# Patient Record
Sex: Male | Born: 1950 | Race: White | Hispanic: No | Marital: Single | Smoking: Former smoker
Health system: Southern US, Community
[De-identification: ages and names within clinical notes are randomized; demographics above are authoritative.]

## PROBLEM LIST (undated history)

## (undated) DIAGNOSIS — J45909 Unspecified asthma, uncomplicated: Secondary | ICD-10-CM

## (undated) DIAGNOSIS — I10 Essential (primary) hypertension: Secondary | ICD-10-CM

## (undated) DIAGNOSIS — I4891 Unspecified atrial fibrillation: Secondary | ICD-10-CM

## (undated) HISTORY — DX: Unspecified atrial fibrillation: I48.91

## (undated) HISTORY — PX: HERNIA REPAIR: SHX51

---

## 2011-06-18 ENCOUNTER — Ambulatory Visit: Payer: Self-pay | Admitting: Internal Medicine

## 2019-05-03 ENCOUNTER — Other Ambulatory Visit: Payer: Self-pay

## 2019-05-03 DIAGNOSIS — Z20822 Contact with and (suspected) exposure to covid-19: Secondary | ICD-10-CM

## 2019-05-04 LAB — NOVEL CORONAVIRUS, NAA: SARS-CoV-2, NAA: NOT DETECTED

## 2019-12-10 ENCOUNTER — Inpatient Hospital Stay
Admission: RE | Admit: 2019-12-10 | Discharge: 2019-12-10 | Disposition: A | Discharge status: Home / Self Care | Payer: Self-pay | Source: Ambulatory Visit

## 2019-12-10 NOTE — Pre-Procedure Instructions (Signed)
Call to patient for preop interview. Patient states that he is going to reschedule his surgery until some time in July due to insurance change. Patient instructed to call Dr. Evelene Croon office on Monday since the office is closed now. Patient acknowledged understanding.

## 2019-12-21 ENCOUNTER — Other Ambulatory Visit: Admission: RE | Admit: 2019-12-21 | Payer: 59 | Source: Ambulatory Visit

## 2020-01-12 ENCOUNTER — Encounter
Admission: RE | Admit: 2020-01-12 | Discharge: 2020-01-12 | Disposition: A | Discharge status: Home / Self Care | Payer: Commercial Managed Care - PPO | Source: Ambulatory Visit | Attending: Urology | Admitting: Urology

## 2020-01-12 ENCOUNTER — Other Ambulatory Visit: Payer: Self-pay

## 2020-01-12 HISTORY — DX: Unspecified asthma, uncomplicated: J45.909

## 2020-01-12 HISTORY — DX: Essential (primary) hypertension: I10

## 2020-01-12 NOTE — Patient Instructions (Signed)
Your procedure is scheduled on: 01/20/20 Report to [facility]. To find out your arrival time please call [phone] between 1PM - 3PM on 01/19/20.  Remember: Instructions that are not followed completely may result in serious medical risk, up to and including death, or upon the discretion of your surgeon and anesthesiologist your surgery may need to be rescheduled.     _X__ 1. Do not eat food after midnight the night before your procedure.                 No gum chewing or hard candies. You may drink clear liquids up to 2 hours                 before you are scheduled to arrive for your surgery- DO not drink clear                 liquids within 2 hours of the start of your surgery.                 Clear Liquids include:  water, apple juice without pulp, clear carbohydrate                 drink such as Clearfast or Gatorade, Black Coffee or Tea (Do not add                 anything to coffee or tea). Diabetics water only  __X__2.  On the morning of surgery brush your teeth with toothpaste and water, you                 may rinse your mouth with mouthwash if you wish.  Do not swallow any              toothpaste of mouthwash.     _X__ 3.  No Alcohol for 24 hours before or after surgery.   _X__ 4.  Do Not Smoke or use e-cigarettes For 24 Hours Prior to Your Surgery.                 Do not use any chewable tobacco products for at least 6 hours prior to                 surgery.  ____  5.  Bring all medications with you on the day of surgery if instructed.   __X__  6.  Notify your doctor if there is any change in your medical condition      (cold, fever, infections).     Do not wear jewelry, make-up, hairpins, clips or nail polish. Do not wear lotions, powders, or perfumes.  Do not shave 48 hours prior to surgery. Men may shave face and neck. Do not bring valuables to the hospital.    [facility] is not responsible for any belongings or  valuables.  Contacts, dentures/partials or body piercings may not be worn into surgery. Bring a case for your contacts, glasses or hearing aids, a denture cup will be supplied. Leave your suitcase in the car. After surgery it may be brought to your room. For patients admitted to the hospital, discharge time is determined by your treatment team.   Patients discharged the day of surgery will not be allowed to drive home.   Please read over the following fact sheets that you were given:   MRSA Information  __X__ Take these medicines the morning of surgery with A SIP OF WATER:  1. diazepam (VALIUM) 5 MG tablet IF NEEDED  2.   3.   4.  5.  6.  ____ Fleet Enema (as directed)   __X__ Use CHG Soap/SAGE wipes as directed  __X__ Use inhalers on the day of surgery  ____ Stop metformin/Janumet/Farxiga 2 days prior to surgery    ____ Take 1/2 of usual insulin dose the night before surgery. No insulin the morning          of surgery.   ____ Stop Blood Thinners Coumadin/Plavix/Xarelto/Pleta/Pradaxa/Eliquis/Effient/Aspirin  on   Or contact your Surgeon, Cardiologist or Medical Doctor regarding  ability to stop your blood thinners  __X__ Stop Anti-inflammatories 7 days before surgery such as Advil, Ibuprofen, Motrin,  BC or Goodies Powder, Naprosyn, Naproxen, Aleve, Aspirin    __X__ Stop all herbal supplements, fish oil or vitamin E until after surgery.    ____ Bring C-Pap to the hospital.    TYLENOL IS OK TO TAKE IF NEEDED

## 2020-01-13 ENCOUNTER — Encounter
Admission: RE | Admit: 2020-01-13 | Discharge: 2020-01-13 | Disposition: A | Discharge status: Home / Self Care | Payer: Commercial Managed Care - PPO | Source: Ambulatory Visit | Attending: Urology | Admitting: Urology

## 2020-01-13 DIAGNOSIS — Z01810 Encounter for preprocedural cardiovascular examination: Secondary | ICD-10-CM | POA: Diagnosis not present

## 2020-01-13 DIAGNOSIS — I10 Essential (primary) hypertension: Secondary | ICD-10-CM | POA: Insufficient documentation

## 2020-01-13 NOTE — H&P (Signed)
NAMECUYLER, VANDYKEN MEDICAL RECORD TL:[id number] ACCOUNT [id number] DATE OF BIRTH:1950-09-07 FACILITY: [facility] LOCATION: [facility] PHYSICIAN:Starlee Corralejo Gilles Chiquito, MD  HISTORY AND PHYSICAL  DATE OF ADMISSION:  01/20/2020  CHIEF COMPLAINT:  Left groin discomfort and swelling.  HISTORY OF PRESENT ILLNESS:  The patient is a 69 year old Caucasian male with right inguinal pain and swelling, who was found to have a reducible inguinal hernia in the office.  He comes in now for right inguinal herniorrhaphy.  PAST MEDICAL HISTORY:    ALLERGIES:  ALLERGIC TO PENICILLIN.  CURRENT MEDICATIONS:  Lisinopril, turmeric, multivitamins, Ventolin, Breo, Ambien, Valium, testosterone cream, and tadalafil.  PAST SURGICAL HISTORY:  Bilateral inguinal herniorrhaphies, 1955.  PAST AND CURRENT MEDICAL CONDITIONS: 1.  Hypertension. 2.  Psoriasis. 3.  Asthma. 4.  Hypogonadism. 5.  BPH. 6.  Erectile dysfunction.  REVIEW OF SYSTEMS:  The patient has decreased auditory acuity.  He denies chest pain, shortness of breath, diabetes, stroke or heart disease.  SOCIAL HISTORY:  The patient quit smoking in 2000 with a 30-pack-year history.  He consumes 4 alcoholic beverages per week.  FAMILY HISTORY:  The patient with a brother who is age 90 with prostate cancer on active surveillance.  He has a father died of heart disease at age 55.  Mother also died of heart disease at age 90.  PHYSICAL EXAMINATION: VITAL SIGNS:  Height 6 feet 1 inch, weight 186, BMI 24. GENERAL:  Well-nourished, white male in no acute distress. HEENT:  Sclerae were clear.  Pupils are equally round, reactive to light and accommodation.  Extraocular movements were intact. NECK:  No palpable masses or tenderness.  Thyroid gland was smooth without palpable nodules. LYMPHATIC:  No palpable cervical or inguinal adenopathy. PULMONARY:  Lungs clear to auscultation. CARDIOVASCULAR:  Regular rhythm and rate without audible murmurs. ABDOMEN:  Soft,  nontender abdomen. GENITOURINARY:  Circumcised.  Testes were smooth, nontender, 18 mL in size each.  He had an easily reducible left inguinal hernia. RECTAL:  Deferred. NEUROMUSCULAR:  Alert and oriented x3.  IMPRESSION:  Symptomatic left inguinal hernia.  PLAN:  Left inguinal herniorrhaphy.  CN/NUANCE  D:01/12/2020 T:01/12/2020 JOB:[id number]

## 2020-01-18 ENCOUNTER — Other Ambulatory Visit: Payer: Self-pay

## 2020-01-18 ENCOUNTER — Other Ambulatory Visit
Admission: RE | Admit: 2020-01-18 | Discharge: 2020-01-18 | Disposition: A | Discharge status: Home / Self Care | Payer: Commercial Managed Care - PPO | Source: Ambulatory Visit | Attending: Urology | Admitting: Urology

## 2020-01-18 DIAGNOSIS — Z20822 Contact with and (suspected) exposure to covid-19: Secondary | ICD-10-CM | POA: Diagnosis not present

## 2020-01-18 LAB — SARS CORONAVIRUS 2 (TAT 6-24 HRS): SARS Coronavirus 2: NEGATIVE

## 2020-01-20 ENCOUNTER — Encounter: Payer: Self-pay | Admitting: Urology

## 2020-01-20 ENCOUNTER — Encounter
Admission: RE | Disposition: A | Discharge status: Home / Self Care | Payer: Self-pay | Source: Home / Self Care | Attending: Urology

## 2020-01-20 ENCOUNTER — Ambulatory Visit: Payer: Commercial Managed Care - PPO | Admitting: Anesthesiology

## 2020-01-20 ENCOUNTER — Ambulatory Visit
Admission: RE | Admit: 2020-01-20 | Discharge: 2020-01-20 | Disposition: A | Discharge status: Home / Self Care | Payer: Commercial Managed Care - PPO | Attending: Urology | Admitting: Urology

## 2020-01-20 ENCOUNTER — Other Ambulatory Visit: Payer: Self-pay

## 2020-01-20 DIAGNOSIS — J45909 Unspecified asthma, uncomplicated: Secondary | ICD-10-CM | POA: Diagnosis not present

## 2020-01-20 DIAGNOSIS — Z79899 Other long term (current) drug therapy: Secondary | ICD-10-CM | POA: Insufficient documentation

## 2020-01-20 DIAGNOSIS — N529 Male erectile dysfunction, unspecified: Secondary | ICD-10-CM | POA: Insufficient documentation

## 2020-01-20 DIAGNOSIS — K4091 Unilateral inguinal hernia, without obstruction or gangrene, recurrent: Secondary | ICD-10-CM | POA: Diagnosis not present

## 2020-01-20 DIAGNOSIS — Z8249 Family history of ischemic heart disease and other diseases of the circulatory system: Secondary | ICD-10-CM | POA: Insufficient documentation

## 2020-01-20 DIAGNOSIS — Z87891 Personal history of nicotine dependence: Secondary | ICD-10-CM | POA: Insufficient documentation

## 2020-01-20 DIAGNOSIS — I10 Essential (primary) hypertension: Secondary | ICD-10-CM | POA: Insufficient documentation

## 2020-01-20 HISTORY — PX: INGUINAL HERNIA REPAIR: SHX194

## 2020-01-20 HISTORY — PX: INSERTION OF MESH: SHX5868

## 2020-01-20 SURGERY — REPAIR, HERNIA, INGUINAL, ADULT
Anesthesia: General | Site: Groin | Laterality: Left

## 2020-01-20 MED ORDER — LIDOCAINE-EPINEPHRINE 1 %-1:100000 IJ SOLN
INTRAMUSCULAR | Status: AC
  Filled 2020-01-20: qty 1

## 2020-01-20 MED ORDER — ORAL CARE MOUTH RINSE: 15.0000 mL | Freq: Once | OROMUCOSAL | Status: AC

## 2020-01-20 MED ORDER — ONDANSETRON HCL 4 MG/2ML IJ SOLN: 4.0000 mg | Freq: Once | INTRAMUSCULAR | Status: DC | PRN

## 2020-01-20 MED ORDER — LIDOCAINE HCL (CARDIAC) PF 100 MG/5ML IV SOSY
PREFILLED_SYRINGE | INTRAVENOUS | Status: DC | PRN
  Administered 2020-01-20: 80 mg via INTRAVENOUS

## 2020-01-20 MED ORDER — CIPROFLOXACIN HCL 500 MG PO TABS
500.0000 mg | ORAL_TABLET | Freq: Two times a day (BID) | ORAL | 0 refills | Status: DC
Start: 2020-01-20 — End: 2020-09-28

## 2020-01-20 MED ORDER — NEOMYCIN-POLYMYXIN B GU 40-200000 IR SOLN
Status: DC | PRN
  Administered 2020-01-20: 2 mL

## 2020-01-20 MED ORDER — ACETAMINOPHEN-CODEINE #3 300-30 MG PO TABS: 1.0000 | ORAL_TABLET | ORAL | 1 refills | Status: DC | PRN

## 2020-01-20 MED ORDER — PHENYLEPHRINE HCL (PRESSORS) 10 MG/ML IV SOLN
INTRAVENOUS | Status: DC | PRN
  Administered 2020-01-20 (×5): 100 ug via INTRAVENOUS

## 2020-01-20 MED ORDER — FAMOTIDINE 20 MG PO TABS
ORAL_TABLET | ORAL | Status: AC
  Administered 2020-01-20: 20 mg via ORAL
  Filled 2020-01-20: qty 1

## 2020-01-20 MED ORDER — FENTANYL CITRATE (PF) 100 MCG/2ML IJ SOLN
INTRAMUSCULAR | Status: AC
  Administered 2020-01-20: 25 ug via INTRAVENOUS
  Filled 2020-01-20: qty 2

## 2020-01-20 MED ORDER — ROCURONIUM BROMIDE 100 MG/10ML IV SOLN
INTRAVENOUS | Status: DC | PRN
  Administered 2020-01-20: 20 mg via INTRAVENOUS
  Administered 2020-01-20: 40 mg via INTRAVENOUS

## 2020-01-20 MED ORDER — DEXAMETHASONE SODIUM PHOSPHATE 10 MG/ML IJ SOLN
INTRAMUSCULAR | Status: AC
  Filled 2020-01-20: qty 1

## 2020-01-20 MED ORDER — FENTANYL CITRATE (PF) 250 MCG/5ML IJ SOLN
INTRAMUSCULAR | Status: AC
  Filled 2020-01-20: qty 5

## 2020-01-20 MED ORDER — DOCUSATE SODIUM 100 MG PO CAPS
200.0000 mg | ORAL_CAPSULE | Freq: Two times a day (BID) | ORAL | 3 refills | Status: AC
Start: 2020-01-20 — End: ?

## 2020-01-20 MED ORDER — MIDAZOLAM HCL 2 MG/2ML IJ SOLN
INTRAMUSCULAR | Status: DC | PRN
  Administered 2020-01-20: 2 mg via INTRAVENOUS

## 2020-01-20 MED ORDER — ONDANSETRON HCL 4 MG/2ML IJ SOLN
INTRAMUSCULAR | Status: AC
  Filled 2020-01-20: qty 2

## 2020-01-20 MED ORDER — ACETAMINOPHEN 10 MG/ML IV SOLN
INTRAVENOUS | Status: DC | PRN
  Administered 2020-01-20: 1000 mg via INTRAVENOUS

## 2020-01-20 MED ORDER — ROCURONIUM BROMIDE 10 MG/ML (PF) SYRINGE
PREFILLED_SYRINGE | INTRAVENOUS | Status: AC
  Filled 2020-01-20: qty 10

## 2020-01-20 MED ORDER — BUPIVACAINE HCL 0.5 % IJ SOLN
INTRAMUSCULAR | Status: DC | PRN
  Administered 2020-01-20: 30 mL

## 2020-01-20 MED ORDER — LIDOCAINE HCL (PF) 1 % IJ SOLN
INTRAMUSCULAR | Status: AC
  Filled 2020-01-20: qty 60

## 2020-01-20 MED ORDER — LACTATED RINGERS IV SOLN: INTRAVENOUS | Status: DC

## 2020-01-20 MED ORDER — KETOROLAC TROMETHAMINE 30 MG/ML IJ SOLN
INTRAMUSCULAR | Status: AC
  Filled 2020-01-20: qty 1

## 2020-01-20 MED ORDER — PROPOFOL 10 MG/ML IV BOLUS
INTRAVENOUS | Status: AC
  Filled 2020-01-20: qty 20

## 2020-01-20 MED ORDER — EPHEDRINE SULFATE 50 MG/ML IJ SOLN
INTRAMUSCULAR | Status: DC | PRN
  Administered 2020-01-20: 10 mg via INTRAVENOUS
  Administered 2020-01-20: 15 mg via INTRAVENOUS

## 2020-01-20 MED ORDER — LEVOFLOXACIN IN D5W 500 MG/100ML IV SOLN
500.0000 mg | INTRAVENOUS | Status: DC
  Administered 2020-01-20: 500 mg via INTRAVENOUS

## 2020-01-20 MED ORDER — DEXAMETHASONE SODIUM PHOSPHATE 10 MG/ML IJ SOLN
INTRAMUSCULAR | Status: DC | PRN
  Administered 2020-01-20: 10 mg via INTRAVENOUS

## 2020-01-20 MED ORDER — ACETAMINOPHEN 10 MG/ML IV SOLN
INTRAVENOUS | Status: AC
  Filled 2020-01-20: qty 100

## 2020-01-20 MED ORDER — PROPOFOL 10 MG/ML IV BOLUS
INTRAVENOUS | Status: DC | PRN
  Administered 2020-01-20: 150 mg via INTRAVENOUS

## 2020-01-20 MED ORDER — LIDOCAINE-EPINEPHRINE 1 %-1:100000 IJ SOLN
INTRAMUSCULAR | Status: DC | PRN
  Administered 2020-01-20: 20 mL

## 2020-01-20 MED ORDER — FAMOTIDINE 20 MG PO TABS: 20.0000 mg | ORAL_TABLET | Freq: Once | ORAL | Status: AC

## 2020-01-20 MED ORDER — KETOROLAC TROMETHAMINE 30 MG/ML IJ SOLN
INTRAMUSCULAR | Status: DC | PRN
  Administered 2020-01-20: 30 mg via INTRAVENOUS

## 2020-01-20 MED ORDER — SUGAMMADEX SODIUM 200 MG/2ML IV SOLN
INTRAVENOUS | Status: DC | PRN
  Administered 2020-01-20: 200 mg via INTRAVENOUS

## 2020-01-20 MED ORDER — EPHEDRINE 5 MG/ML INJ
INTRAVENOUS | Status: AC
  Filled 2020-01-20: qty 10

## 2020-01-20 MED ORDER — LIDOCAINE HCL (PF) 2 % IJ SOLN
INTRAMUSCULAR | Status: AC
  Filled 2020-01-20: qty 5

## 2020-01-20 MED ORDER — VASOPRESSIN 20 UNIT/ML IV SOLN
INTRAVENOUS | Status: DC | PRN
  Administered 2020-01-20: 4 [IU] via INTRAVENOUS

## 2020-01-20 MED ORDER — EPINEPHRINE PF 1 MG/ML IJ SOLN
INTRAMUSCULAR | Status: AC
  Filled 2020-01-20: qty 1

## 2020-01-20 MED ORDER — CHLORHEXIDINE GLUCONATE 0.12 % MT SOLN
OROMUCOSAL | Status: AC
  Administered 2020-01-20: 15 mL via OROMUCOSAL
  Filled 2020-01-20: qty 15

## 2020-01-20 MED ORDER — BUPIVACAINE HCL (PF) 0.5 % IJ SOLN
INTRAMUSCULAR | Status: AC
  Filled 2020-01-20: qty 30

## 2020-01-20 MED ORDER — FENTANYL CITRATE (PF) 100 MCG/2ML IJ SOLN
25.0000 ug | INTRAMUSCULAR | Status: DC | PRN
  Administered 2020-01-20 (×2): 25 ug via INTRAVENOUS

## 2020-01-20 MED ORDER — VASOPRESSIN 20 UNIT/ML IV SOLN
INTRAVENOUS | Status: AC
  Filled 2020-01-20: qty 1

## 2020-01-20 MED ORDER — LIDOCAINE HCL 4 % MT SOLN
OROMUCOSAL | Status: DC | PRN
  Administered 2020-01-20: 4 mL via TOPICAL

## 2020-01-20 MED ORDER — MIDAZOLAM HCL 2 MG/2ML IJ SOLN
INTRAMUSCULAR | Status: AC
  Filled 2020-01-20: qty 2

## 2020-01-20 MED ORDER — ONDANSETRON HCL 4 MG/2ML IJ SOLN
INTRAMUSCULAR | Status: DC | PRN
  Administered 2020-01-20: 4 mg via INTRAVENOUS

## 2020-01-20 MED ORDER — LEVOFLOXACIN IN D5W 500 MG/100ML IV SOLN
INTRAVENOUS | Status: AC
  Filled 2020-01-20: qty 100

## 2020-01-20 MED ORDER — CHLORHEXIDINE GLUCONATE 0.12 % MT SOLN: 15.0000 mL | Freq: Once | OROMUCOSAL | Status: AC

## 2020-01-20 MED ORDER — FENTANYL CITRATE (PF) 250 MCG/5ML IJ SOLN
INTRAMUSCULAR | Status: DC | PRN
  Administered 2020-01-20 (×2): 50 ug via INTRAVENOUS

## 2020-01-20 SURGICAL SUPPLY — 39 items
BLADE SURG 15 STRL LF DISP TIS (BLADE) ×1 IMPLANT
BLADE SURG 15 STRL SS (BLADE) ×1
CANISTER SUCT 1200ML W/VALVE (MISCELLANEOUS) ×2 IMPLANT
CHLORAPREP W/TINT 26 (MISCELLANEOUS) ×2 IMPLANT
COVER WAND RF STERILE (DRAPES) ×2 IMPLANT
DERMABOND ADVANCED (GAUZE/BANDAGES/DRESSINGS) ×1
DERMABOND ADVANCED .7 DNX12 (GAUZE/BANDAGES/DRESSINGS) ×1 IMPLANT
DRAIN PENROSE 1/4X12 LTX STRL (WOUND CARE) ×2 IMPLANT
DRAPE LAPAROTOMY 100X77 ABD (DRAPES) ×2 IMPLANT
DRSG GAUZE FLUFF 36X18 (GAUZE/BANDAGES/DRESSINGS) ×1 IMPLANT
DRSG TEGADERM 4X4.75 (GAUZE/BANDAGES/DRESSINGS) ×2 IMPLANT
DRSG TELFA 4X3 1S NADH ST (GAUZE/BANDAGES/DRESSINGS) ×2 IMPLANT
ELECT REM PT RETURN 9FT ADLT (ELECTROSURGICAL) ×2
ELECTRODE REM PT RTRN 9FT ADLT (ELECTROSURGICAL) ×1 IMPLANT
GLOVE BIO SURGEON STRL SZ7.5 (GLOVE) ×2 IMPLANT
GOWN STRL REUS W/ TWL LRG LVL3 (GOWN DISPOSABLE) ×1 IMPLANT
GOWN STRL REUS W/ TWL XL LVL3 (GOWN DISPOSABLE) ×1 IMPLANT
GOWN STRL REUS W/TWL LRG LVL3 (GOWN DISPOSABLE) ×1
GOWN STRL REUS W/TWL XL LVL3 (GOWN DISPOSABLE) ×1
KIT TURNOVER KIT A (KITS) ×2 IMPLANT
LABEL OR SOLS (LABEL) ×2 IMPLANT
MESH HERNIA 4.5X10 SYS PRO LRG (Mesh General) IMPLANT
MESH HERNIA SYS PROLENE LG (Mesh General) ×1 IMPLANT
NDL HYPO 25X1 1.5 SAFETY (NEEDLE) ×1 IMPLANT
NEEDLE HYPO 25X1 1.5 SAFETY (NEEDLE) ×2 IMPLANT
NS IRRIG 500ML POUR BTL (IV SOLUTION) ×2 IMPLANT
PACK BASIN MINOR (MISCELLANEOUS) ×2 IMPLANT
SPONGE KITTNER 5P (MISCELLANEOUS) ×2 IMPLANT
STRIP CLOSURE SKIN 1/2X4 (GAUZE/BANDAGES/DRESSINGS) ×2 IMPLANT
SUPPORT SCROTAL LG STRP (MISCELLANEOUS) ×2 IMPLANT
SUT CHROMIC 3-0 (SUTURE) ×1
SUT CHROMIC 3-0 54XMFL REEL CR (SUTURE) ×1
SUT PLAIN 3 0 SH 27IN (SUTURE) ×3 IMPLANT
SUT SURGILON 0 BLK (SUTURE) ×2 IMPLANT
SUT VIC AB 4-0 PS2 18 (SUTURE) ×2 IMPLANT
SUTURE CHRMC 3-0 54XMFL REL CR (SUTURE) ×1 IMPLANT
SWABSTK COMLB BENZOIN TINCTURE (MISCELLANEOUS) ×2 IMPLANT
SYR 10ML LL (SYRINGE) ×2 IMPLANT
SYR BULB IRRIG 60ML STRL (SYRINGE) ×2 IMPLANT

## 2020-01-20 NOTE — Anesthesia Postprocedure Evaluation (Signed)
Anesthesia Post Note  Patient: Gerald Garcia  Procedure(s) Performed: HERNIA REPAIR INGUINAL ADULT (Left Groin) INSERTION OF MESH (Left Groin)  Patient location during evaluation: PACU Anesthesia Type: General Level of consciousness: awake and alert Pain management: pain level controlled Vital Signs Assessment: post-procedure vital signs reviewed and stable Respiratory status: spontaneous breathing and respiratory function stable Cardiovascular status: stable Anesthetic complications: no   No complications documented.   Last Vitals:  Vitals:   01/20/20 0943 01/20/20 0948  BP:  124/76  Pulse:  79  Resp:  20  Temp:    SpO2: 94% 94%    Last Pain:  Vitals:   01/20/20 0955  TempSrc:   PainSc: Asleep                 Rozell Kettlewell K

## 2020-01-20 NOTE — Anesthesia Procedure Notes (Signed)
Procedure Name: Intubation Date/Time: 01/20/2020 7:34 AM Performed by: Dava Najjar, CRNA Pre-anesthesia Checklist: Patient identified, Emergency Drugs available, Suction available and Patient being monitored Patient Re-evaluated:Patient Re-evaluated prior to induction Oxygen Delivery Method: Circle system utilized Preoxygenation: Pre-oxygenation with 100% oxygen Induction Type: IV induction Ventilation: Mask ventilation without difficulty Laryngoscope Size: Miller and 2 Grade View: Grade I Tube type: Oral Tube size: 7.5 mm Number of attempts: 1 Airway Equipment and Method: Stylet Placement Confirmation: ETT inserted through vocal cords under direct vision,  positive ETCO2 and breath sounds checked- equal and bilateral Secured at: 24 cm Tube secured with: Tape Dental Injury: Teeth and Oropharynx as per pre-operative assessment

## 2020-01-20 NOTE — Op Note (Signed)
Preoperative diagnosis: Recurrent left inguinal hernia (K40.91)  Postoperative diagnosis: Same  Procedure: Left inguinal herniorrhaphy (CPT [phone])  Surgeon: Suszanne Conners. Evelene Croon MD  Anesthesia: General  Indications:See the history and physical. After informed consent the above procedure(s) were requested     Technique and findings: After adequate general anesthesia had been obtained the lower abdomen and perineum were prepped and draped in usual fashion.  Inguinal incision was made over the previous scar.  Incision was then carried down through the fatty tissue with the electrocautery.  External oblique fascia and spermatic cord were identified.  The external oblique fascia was then opened along the course of its fibers to further expose the spermatic cord.  Cremasteric fibers were bluntly divided further explored exposing the spermatic cord and the hernia sac.  Hernia sac was then bluntly dissected back to the internal ring.  The hernia sac was then ligated with 2-0 Surgilon at its base and excess hernia sac excised and discarded.  Ilioinguinal nerve was identified and carefully preserved.  Retropubic space was developed using finger dissection.  The retropubic space was then irrigated with GU irrigant.  A large con PHS the graft was selected and the circular portion of the graft was placed in the retropubic space and unfurled.  The oblong external portion of the graft was then placed beneath the external oblique fascia.  A keyhole incision in the external portion the graft was made and brought around the spermatic cord and anchored to the inguinal ligament with a 2-0 Surgilon suture.  The distal edge of the external portion of the graft was then anchored to the pubic tubercle with a 2 oh Surgilon suture.  Spermatic cord block was then performed with a solution of 50% 0.5% Marcaine and 50% 1% Xylocaine.  Spermatic cord was then placed into its normal anatomic position.  Sternal oblique fascia was then  reapproximated with interrupted 2-0 Surgilon suture.  Subcutaneous fatty tissue reapproximated 3-0 plain catgut and skin closed with 4-0 subcuticular Vicryl suture.  Dermabond benzoin and Steri-Strips were applied.  Sponge, instrument, and needle counts were noted to be correct.  Blood loss was minimal.  Procedure was then terminated and patient transferred to the recovery room in stable condition.

## 2020-01-20 NOTE — H&P (Signed)
Date of Initial H&P: 01/12/20  History reviewed, patient examined, no change in status, stable for surgery. 

## 2020-01-20 NOTE — Discharge Instructions (Signed)
AMBULATORY SURGERY  DISCHARGE INSTRUCTIONS   1) The drugs that you were given will stay in your system until tomorrow so for the next 24 hours you should not:  A) Drive an automobile B) Make any legal decisions C) Drink any alcoholic beverage   2) You may resume regular meals tomorrow.  Today it is better to start with liquids and gradually work up to solid foods.  You may eat anything you prefer, but it is better to start with liquids, then soup and crackers, and gradually work up to solid foods.   3) Please notify your doctor immediately if you have any unusual bleeding, trouble breathing, redness and pain at the surgery site, drainage, fever, or pain not relieved by medication.    4) Additional Instructions:        Please contact your physician with any problems or Same Day Surgery at [phone], Monday through Friday 6 am to 4 pm, or [place] at [facility] number at [phone].Open Hernia Repair, Adult, Care After This sheet gives you information about how to care for yourself after your procedure. Your health care provider may also give you more specific instructions. If you have problems or questions, contact your health care provider. What can I expect after the procedure? After the procedure, it is common to have:  Mild discomfort.  Slight bruising.  Minor swelling.  Pain in the abdomen. Follow these instructions at home: Incision care   Follow instructions from your health care provider about how to take care of your incision area. Make sure you: ? Wash your hands with soap and water before you change your bandage (dressing). If soap and water are not available, use hand sanitizer. ? Change your dressing as told by your health care provider. ? Leave stitches (sutures), skin glue, or adhesive strips in place. These skin closures may need to stay in place for 2 weeks or longer. If adhesive strip edges start to loosen and curl up, you may trim the loose  edges. Do not remove adhesive strips completely unless your health care provider tells you to do that.  Check your incision area every day for signs of infection. Check for: ? More redness, swelling, or pain. ? More fluid or blood. ? Warmth. ? Pus or a bad smell. Activity  Do not drive or use heavy machinery while taking prescription pain medicine. Do not drive until your health care provider approves.  Until your health care provider approves: ? Do not lift anything that is heavier than 10 lb (4.5 kg). ? Do not play contact sports.  Return to your normal activities as told by your health care provider. Ask your health care provider what activities are safe. General instructions  To prevent or treat constipation while you are taking prescription pain medicine, your health care provider may recommend that you: ? Drink enough fluid to keep your urine clear or pale yellow. ? Take over-the-counter or prescription medicines. ? Eat foods that are high in fiber, such as fresh fruits and vegetables, whole grains, and beans. ? Limit foods that are high in fat and processed sugars, such as fried and sweet foods.  Take over-the-counter and prescription medicines only as told by your health care provider.  Do not take tub baths or go swimming until your health care provider approves.  Keep all follow-up visits as told by your health care provider. This is important. Contact a health care provider if:  You develop a rash.  You have more redness,  rash.  You have more redness, swelling, or pain around your incision.  You have more fluid or blood coming from your incision.  Your incision feels warm to the touch.  You have pus or a bad smell coming from your incision.  You have a fever or chills.  You have blood in your stool (feces).  You have not had a bowel movement in 2-3 days.  Your pain is not controlled with medicine. Get help right away if:  You have chest pain or shortness of  breath.  You feel light-headed or feel faint.  You have severe pain.  You vomit and your pain is worse. This information is not intended to replace advice given to you by your health care provider. Make sure you discuss any questions you have with your health care provider. Document Revised: 06/06/2017 Document Reviewed: 12/06/2015 Elsevier Patient Education  2020 [employer].  

## 2020-01-20 NOTE — Anesthesia Preprocedure Evaluation (Addendum)
Anesthesia Evaluation  Patient identified by MRN, date of birth, ID band Patient awake    Reviewed: Allergy & Precautions, NPO status , Patient's Chart, lab work & pertinent test results  History of Anesthesia Complications Negative for: history of anesthetic complications  Airway Mallampati: II       Dental   Pulmonary asthma , neg sleep apnea, neg COPD, Not current smoker, former smoker,           Cardiovascular hypertension, Pt. on medications (-) Past MI and (-) CHF (-) dysrhythmias (-) Valvular Problems/Murmurs     Neuro/Psych Seizures - (2 times in college, no meds, no problems since),     GI/Hepatic Neg liver ROS, neg GERD  ,  Endo/Other  neg diabetes  Renal/GU negative Renal ROS     Musculoskeletal   Abdominal   Peds  Hematology   Anesthesia Other Findings   Reproductive/Obstetrics                           Anesthesia Physical Anesthesia Plan  ASA: II  Anesthesia Plan: General   Post-op Pain Management:    Induction: Intravenous  PONV Risk Score and Plan: 2 and Ondansetron and Dexamethasone  Airway Management Planned: Oral ETT  Additional Equipment:   Intra-op Plan:   Post-operative Plan:   Informed Consent: I have reviewed the patients History and Physical, chart, labs and discussed the procedure including the risks, benefits and alternatives for the proposed anesthesia with the patient or authorized representative who has indicated his/her understanding and acceptance.       Plan Discussed with:   Anesthesia Plan Comments:         Anesthesia Quick Evaluation

## 2020-01-20 NOTE — Transfer of Care (Addendum)
Immediate Anesthesia Transfer of Care Note  Patient: Gerald Garcia  Procedure(s) Performed: HERNIA REPAIR INGUINAL ADULT (Left Groin) INSERTION OF MESH (Left Groin)  Patient Location: PACU  Anesthesia Type:General  Level of Consciousness: drowsy  Airway & Oxygen Therapy: Patient Spontanous Breathing and Patient connected to face mask oxygen  Post-op Assessment: Report given to RN and Post -op Vital signs reviewed and stable  Post vital signs: Reviewed and stable  Last Vitals:  Vitals Value Taken Time  BP 127/72 01/20/20 0918  Temp    Pulse 81 01/20/20 0919  Resp 21 01/20/20 0919  SpO2 100 % 01/20/20 0919  Vitals shown include unvalidated device data.  Last Pain:  Vitals:   01/20/20 0627  TempSrc: Temporal  PainSc: 0-No pain         Complications: No complications documented.

## 2020-01-21 ENCOUNTER — Encounter: Payer: Self-pay | Admitting: Urology

## 2020-01-24 ENCOUNTER — Telehealth: Payer: Self-pay | Admitting: *Deleted

## 2020-01-24 NOTE — Telephone Encounter (Signed)
Patient called. Patient answered but asked if he could discuss matter at a later time. Patient will need to be informed of water issue with the [facility] during the time of recent procedure.Call disconnected before call back number could be provided. If patient returns call please transfer to [facility], Triage RN.

## 2020-10-03 ENCOUNTER — Other Ambulatory Visit
Admission: RE | Admit: 2020-10-03 | Discharge: 2020-10-03 | Disposition: A | Discharge status: Home / Self Care | Payer: Commercial Managed Care - PPO | Source: Ambulatory Visit | Attending: Cardiology | Admitting: Cardiology

## 2020-10-03 ENCOUNTER — Other Ambulatory Visit: Payer: Self-pay

## 2020-10-03 DIAGNOSIS — Z01812 Encounter for preprocedural laboratory examination: Secondary | ICD-10-CM | POA: Diagnosis present

## 2020-10-03 DIAGNOSIS — Z20822 Contact with and (suspected) exposure to covid-19: Secondary | ICD-10-CM | POA: Diagnosis not present

## 2020-10-03 LAB — SARS CORONAVIRUS 2 (TAT 6-24 HRS): SARS Coronavirus 2: NEGATIVE

## 2020-10-05 ENCOUNTER — Encounter
Admission: RE | Disposition: A | Discharge status: Home / Self Care | Payer: Self-pay | Source: Home / Self Care | Attending: Cardiology

## 2020-10-05 ENCOUNTER — Ambulatory Visit: Payer: Commercial Managed Care - PPO | Admitting: Anesthesiology

## 2020-10-05 ENCOUNTER — Ambulatory Visit
Admission: RE | Admit: 2020-10-05 | Discharge: 2020-10-05 | Disposition: A | Discharge status: Home / Self Care | Payer: Commercial Managed Care - PPO | Attending: Cardiology | Admitting: Cardiology

## 2020-10-05 ENCOUNTER — Encounter: Payer: Self-pay | Admitting: Cardiology

## 2020-10-05 DIAGNOSIS — Z79899 Other long term (current) drug therapy: Secondary | ICD-10-CM | POA: Diagnosis not present

## 2020-10-05 DIAGNOSIS — I480 Paroxysmal atrial fibrillation: Secondary | ICD-10-CM | POA: Insufficient documentation

## 2020-10-05 DIAGNOSIS — Z880 Allergy status to penicillin: Secondary | ICD-10-CM | POA: Insufficient documentation

## 2020-10-05 DIAGNOSIS — Z7901 Long term (current) use of anticoagulants: Secondary | ICD-10-CM | POA: Insufficient documentation

## 2020-10-05 DIAGNOSIS — I471 Supraventricular tachycardia: Secondary | ICD-10-CM | POA: Insufficient documentation

## 2020-10-05 DIAGNOSIS — I4892 Unspecified atrial flutter: Secondary | ICD-10-CM | POA: Insufficient documentation

## 2020-10-05 HISTORY — PX: CARDIOVERSION: SHX1299

## 2020-10-05 SURGERY — CARDIOVERSION
Anesthesia: General

## 2020-10-05 MED ORDER — PROPOFOL 10 MG/ML IV BOLUS
INTRAVENOUS | Status: AC
  Filled 2020-10-05: qty 20

## 2020-10-05 MED ORDER — HYDROCORTISONE 1 % EX CREA
1.0000 "application " | TOPICAL_CREAM | Freq: Three times a day (TID) | CUTANEOUS | Status: DC | PRN
  Filled 2020-10-05: qty 28

## 2020-10-05 MED ORDER — PROPOFOL 10 MG/ML IV BOLUS
INTRAVENOUS | Status: DC | PRN
  Administered 2020-10-05: 70 mg via INTRAVENOUS

## 2020-10-05 MED ORDER — SODIUM CHLORIDE 0.9 % IV SOLN: INTRAVENOUS | Status: DC | PRN

## 2020-10-05 NOTE — Discharge Instructions (Signed)
Electrical Cardioversion Electrical cardioversion is the delivery of a jolt of electricity to restore a normal rhythm to the heart. A rhythm that is too fast or is not regular keeps the heart from pumping well. In this procedure, sticky patches or metal paddles are placed on the chest to deliver electricity to the heart from a device. This procedure may be done in an emergency if:  There is low or no blood pressure as a result of the heart rhythm.  Normal rhythm must be restored as fast as possible to protect the brain and heart from further damage.  It may save a life. This may also be a scheduled procedure for irregular or fast heart rhythms that are not immediately life-threatening. Tell a health care provider about:  Any allergies you have.  All medicines you are taking, including vitamins, herbs, eye drops, creams, and over-the-counter medicines.  Any problems you or family members have had with anesthetic medicines.  Any blood disorders you have.  Any surgeries you have had.  Any medical conditions you have.  Whether you are pregnant or may be pregnant. What are the risks? Generally, this is a safe procedure. However, problems may occur, including:  Allergic reactions to medicines.  A blood clot that breaks free and travels to other parts of your body.  The possible return of an abnormal heart rhythm within hours or days after the procedure.  Your heart stopping (cardiac arrest). This is rare. What happens before the procedure? Medicines  Your health care provider may have you start taking: ? Blood-thinning medicines (anticoagulants) so your blood does not clot as easily. ? Medicines to help stabilize your heart rate and rhythm.  Ask your health care provider about: ? Changing or stopping your regular medicines. This is especially important if you are taking diabetes medicines or blood thinners. ? Taking medicines such as aspirin and ibuprofen. These medicines can  thin your blood. Do not take these medicines unless your health care provider tells you to take them. ? Taking over-the-counter medicines, vitamins, herbs, and supplements. General instructions  Follow instructions from your health care provider about eating or drinking restrictions.  Plan to have someone take you home from the hospital or clinic.  If you will be going home right after the procedure, plan to have someone with you for 24 hours.  Ask your health care provider what steps will be taken to help prevent infection. These may include washing your skin with a germ-killing soap. What happens during the procedure?  An IV will be inserted into one of your veins.  Sticky patches (electrodes) or metal paddles may be placed on your chest.  You will be given a medicine to help you relax (sedative).  An electrical shock will be delivered. The procedure may vary among health care providers and hospitals.   What can I expect after the procedure?  Your blood pressure, heart rate, breathing rate, and blood oxygen level will be monitored until you leave the hospital or clinic.  Your heart rhythm will be watched to make sure it does not change.  You may have some redness on the skin where the shocks were given. Follow these instructions at home:  Do not drive for 24 hours if you were given a sedative during your procedure.  Take over-the-counter and prescription medicines only as told by your health care provider.  Ask your health care provider how to check your pulse. Check it often.  Rest for 48 hours after the procedure   or as told by your health care provider.  Avoid or limit your caffeine use as told by your health care provider.  Keep all follow-up visits as told by your health care provider. This is important. Contact a health care provider if:  You feel like your heart is beating too quickly or your pulse is not regular.  You have a serious muscle cramp that does not go  away. Get help right away if:  You have discomfort in your chest.  You are dizzy or you feel faint.  You have trouble breathing or you are short of breath.  Your speech is slurred.  You have trouble moving an arm or leg on one side of your body.  Your fingers or toes turn cold or blue. Summary  Electrical cardioversion is the delivery of a jolt of electricity to restore a normal rhythm to the heart.  This procedure may be done right away in an emergency or may be a scheduled procedure if the condition is not an emergency.  Generally, this is a safe procedure.  After the procedure, check your pulse often as told by your health care provider. This information is not intended to replace advice given to you by your health care provider. Make sure you discuss any questions you have with your health care provider. Document Revised: 01/25/2019 Document Reviewed: 01/25/2019 Elsevier Patient Education  2021 [employer] Moderate Conscious Sedation, Adult, Care After This sheet gives you information about how to care for yourself after your procedure. Your health care provider may also give you more specific instructions. If you have problems or questions, contact your health care provider. What can I expect after the procedure? After the procedure, it is common to have:  Sleepiness for several hours.  Impaired judgment for several hours.  Difficulty with balance.  Vomiting if you eat too soon. Follow these instructions at home: For the time period you were told by your health care provider:  Rest.  Do not participate in activities where you could fall or become injured.  Do not drive or use machinery.  Do not drink alcohol.  Do not take sleeping pills or medicines that cause drowsiness.  Do not make important decisions or sign legal documents.  Do not take care of children on your own.      Eating and drinking  Follow the diet recommended by your health care  provider.  Drink enough fluid to keep your urine pale yellow.  If you vomit: ? Drink water, juice, or soup when you can drink without vomiting. ? Make sure you have little or no nausea before eating solid foods.   General instructions  Take over-the-counter and prescription medicines only as told by your health care provider.  Have a responsible adult stay with you for the time you are told. It is important to have someone help care for you until you are awake and alert.  Do not smoke.  Keep all follow-up visits as told by your health care provider. This is important. Contact a health care provider if:  You are still sleepy or having trouble with balance after 24 hours.  You feel light-headed.  You keep feeling nauseous or you keep vomiting.  You develop a rash.  You have a fever.  You have redness or swelling around the IV site. Get help right away if:  You have trouble breathing.  You have new-onset confusion at home. Summary  After the procedure, it is common to feel sleepy, have   impaired judgment, or feel nauseous if you eat too soon.  Rest after you get home. Know the things you should not do after the procedure.  Follow the diet recommended by your health care provider and drink enough fluid to keep your urine pale yellow.  Get help right away if you have trouble breathing or new-onset confusion at home. This information is not intended to replace advice given to you by your health care provider. Make sure you discuss any questions you have with your health care provider. Document Revised: 10/22/2019 Document Reviewed: 05/20/2019 Elsevier Patient Education  2021 [employer] Inc.  

## 2020-10-05 NOTE — Transfer of Care (Signed)
Immediate Anesthesia Transfer of Care Note  Patient: Gerald Garcia  Procedure(s) Performed: CARDIOVERSION (N/A )  Patient Location: PACU and special recoveries  Anesthesia Type:General  Level of Consciousness: drowsy  Airway & Oxygen Therapy: Patient Spontanous Breathing and Patient connected to nasal cannula oxygen  Post-op Assessment: Report given to RN and Post -op Vital signs reviewed and stable  Post vital signs: Reviewed and stable  Last Vitals:  Vitals Value Taken Time  BP 116/71 10/05/20 0741  Temp    Pulse 80 10/05/20 0741  Resp 27 10/05/20 0741  SpO2 95 % 10/05/20 0741    Last Pain:  Vitals:   10/05/20 0712  TempSrc: Oral  PainSc: 0-No pain         Complications: No complications documented.

## 2020-10-05 NOTE — CV Procedure (Signed)
Procedure: DC Cardioversion Indication: symptomatic atrial flutter  After informed consent, time out protocol and adequant sedation per departemnt of anesthesia, pt received a singe 120J biphasic dc shock with conversion to nsr. No immediate complications.

## 2020-10-05 NOTE — H&P (Signed)
Chief Complaint: Chief Complaint  Patient presents with  . Atrial Fibrillation  Date of Service: 09/26/2020 Date of Birth: [birth year] PCP: [name], MD  History of Present Illness: Gerald Garcia is a 70 y.o.male patient who presents for follow-up visit. Continues to have A. fib with RVR. Patient had no prior cardiac history. Is on lisinopril for blood pressure. He noted his heart racing on his apple watch over the past several days. He presented to his primary care doctor's office was noted to be tachycardic and was referred for evaluation. In our office electrocardiogram showed atrial flutter/fib with RVR ventricular response rate of 131. He is hemodynamically stable. He is aware his heart is racing but denies chest pain lightheadedness syncope or presyncope. He is compliant with his medications and has had difficult to control A. fib despite being on diltiazem 240+ metoprolol. We will proceed with cardioversion after adequate anticoagulation. Past Medical and Surgical History  Past Medical History History reviewed. No pertinent past medical history.  Past Surgical History He has no past surgical history on file.   Medications and Allergies  Current Medications  Current Outpatient Medications  Medication Sig Dispense Refill  . apixaban (ELIQUIS) 5 mg tablet Take 1 tablet (5 mg total) by mouth every 12 (twelve) hours 60 tablet 11  . diltiazem (CARDIZEM CD) 120 MG XR capsule Take 2 capsules (240 mg total) by mouth once daily 30 capsule 11  . lisinopriL (ZESTRIL) 5 MG tablet Take 5 mg by mouth once daily  . metoprolol tartrate (LOPRESSOR) 25 MG tablet Take 2 tablets (50 mg total) by mouth 2 (two) times daily 120 tablet 11   No current facility-administered medications for this visit.   Allergies: Penicillins  Social and Family History  Social History reports that he has never smoked. He has never used smokeless tobacco. He reports current alcohol use of about 4.0 standard drinks of  alcohol per week. He reports previous drug use.  Family History History reviewed. No pertinent family history.  Review of Systems  Review of Systems  Constitutional: Negative for chills, diaphoresis, fever, malaise/fatigue and weight loss.  HENT: Negative for congestion, ear discharge, hearing loss and tinnitus.  Eyes: Negative for blurred vision.  Respiratory: Positive for shortness of breath. Negative for cough, hemoptysis, sputum production and wheezing.  Cardiovascular: Positive for palpitations. Negative for chest pain, orthopnea, claudication, leg swelling and PND.  Gastrointestinal: Negative for abdominal pain, blood in stool, constipation, diarrhea, heartburn, melena, nausea and vomiting.  Genitourinary: Negative for dysuria, frequency, hematuria and urgency.  Musculoskeletal: Negative for back pain, falls, joint pain and myalgias.  Skin: Negative for itching and rash.  Neurological: Negative for dizziness, tingling, focal weakness, loss of consciousness, weakness and headaches.  Endo/Heme/Allergies: Negative for polydipsia. Does not bruise/bleed easily.  Psychiatric/Behavioral: Negative for depression, memory loss and substance abuse. The patient is not nervous/anxious.   Physical Examination   Vitals:BP 122/78  Pulse (!) 129  Resp 16  Ht 188 cm (6\' 2" )  Wt 87.9 kg (193 lb 12.8 oz)  SpO2 94%  BMI 24.88 kg/m  Ht:188 cm (6\' 2" ) Wt:87.9 kg (193 lb 12.8 oz) surface area is 2.14 meters squared. Body mass index is 24.88 kg/m.  Wt Readings from Last 3 Encounters:  09/26/20 87.9 kg (193 lb 12.8 oz)  09/15/20 85.3 kg (188 lb)   BP Readings from Last 3 Encounters:  09/26/20 122/78  09/15/20 (!) 140/92   General appearance appears in no acute distress  Head Mouth and  Eye exam Normocephalic, without obvious abnormality, atraumatic Dentition is good Eyes appear anicteric       LUNGS Breath Sounds: Normal Percussion: Normal  CARDIOVASCULAR JVP CV wave:  no HJR: no Elevation at 90 degrees: None Carotid Pulse: normal pulsation bilaterally Bruit: None Apex: apical impulse normal  Auscultation Rhythm: atrial flutter with 3:1 block S1: normal S2: normal Clicks: no Rub: no Murmurs: no murmurs  Gallop: None  EXTREMITIES Clubbing: no Edema: trace to 1+ bilateral pedal edema Pulses: peripheral pulses symmetrical Femoral Bruits: no Amputation: no SKIN Rash: no Cyanosis: no Embolic phemonenon: no Bruising: no NEURO Alert and Oriented to person, place and time: yes Non focal: yes  PSYCH: Pt appears to have normal affect  LABS REVIEWED Last 3 CBC results: No results found for: WBC No results found for: HGB No results found for: HCT  No results found for: PLT  No results found for: CREATININE, BUN, NA, K, CL, CO2  No results found for: HGBA1C  No results found for: HDL No results found for: LDLCALC No results found for: TRIG  No results found for: ALT, AST, GGT, ALKPHOS, BILITOT  No results found for: TSH  Diagnostic Studies Reviewed:  EKG EKG demonstrated Atrial for flutter with RVR.  Assessment and Plan   70 y.o. male with  ICD-10-CM  1. Heart palpitations secondary to A. fib/flutter R00.2  2. Paroxysmal atrial fibrillation (CMS-HCC)-EKG shows probable a flutter/A. fib with rapid response. We will continue with Cardizem CD at 240 metoprolol tartrate to 25 twice daily. We will continue with Eliquis and proceed with cardioversion due to symptomatic A. fib after adequate anticoagulation. I48.0    Return in about 4 weeks (around 10/24/2020).  These notes generated with voice recognition software. I apologize for typographical errors.  Denton Ar, MD  Pt seen and examined. No change from above.

## 2020-10-05 NOTE — Anesthesia Preprocedure Evaluation (Addendum)
Anesthesia Evaluation  Patient identified by MRN, date of birth, ID band Patient awake    Reviewed: Allergy & Precautions, H&P , NPO status , Patient's Chart, lab work & pertinent test results, reviewed documented beta blocker date and time   Airway Mallampati: III   Neck ROM: full    Dental  (+) Poor Dentition   Pulmonary neg pulmonary ROS, asthma , former smoker,    Pulmonary exam normal        Cardiovascular Exercise Tolerance: Good hypertension, On Medications negative cardio ROS Normal cardiovascular examAtrial Fibrillation  Rhythm:regular Rate:Normal     Neuro/Psych negative neurological ROS  negative psych ROS   GI/Hepatic negative GI ROS, Neg liver ROS,   Endo/Other  negative endocrine ROS  Renal/GU negative Renal ROS  negative genitourinary   Musculoskeletal   Abdominal   Peds  Hematology negative hematology ROS (+)   Anesthesia Other Findings Past Medical History: No date: Asthma No date: Hypertension Past Surgical History: No date: HERNIA REPAIR     Comment:  x2 as infant 01/20/2020: INGUINAL HERNIA REPAIR; Left     Comment:  Procedure: HERNIA REPAIR INGUINAL ADULT;  Surgeon:               Orson Ape, MD;  Location: [facility] ORS;  Service:               Urology;  Laterality: Left; 01/20/2020: INSERTION OF MESH; Left     Comment:  Procedure: INSERTION OF MESH;  Surgeon: Orson Ape, MD;  Location: [facility] ORS;  Service: Urology;                Laterality: Left;   Reproductive/Obstetrics negative OB ROS                            Anesthesia Physical Anesthesia Plan  ASA: IV  Anesthesia Plan: General   Post-op Pain Management:    Induction:   PONV Risk Score and Plan:   Airway Management Planned:   Additional Equipment:   Intra-op Plan:   Post-operative Plan:   Informed Consent: I have reviewed the patients History and Physical, chart, labs  and discussed the procedure including the risks, benefits and alternatives for the proposed anesthesia with the patient or authorized representative who has indicated his/her understanding and acceptance.     Dental Advisory Given  Plan Discussed with: CRNA  Anesthesia Plan Comments:        Anesthesia Quick Evaluation

## 2020-10-05 NOTE — Anesthesia Procedure Notes (Signed)
Procedure Name: MAC Date/Time: 10/05/2020 7:32 AM Performed by: Jerrye Noble, CRNA Pre-anesthesia Checklist: Patient identified, Emergency Drugs available, Suction available and Patient being monitored Patient Re-evaluated:Patient Re-evaluated prior to induction Oxygen Delivery Method: Nasal cannula

## 2020-10-10 NOTE — Anesthesia Postprocedure Evaluation (Signed)
Anesthesia Post Note  Patient: Yandell Mcjunkins  Procedure(s) Performed: CARDIOVERSION (N/A )  Patient location during evaluation: PACU Anesthesia Type: General Level of consciousness: awake and alert Pain management: pain level controlled Vital Signs Assessment: post-procedure vital signs reviewed and stable Respiratory status: spontaneous breathing, nonlabored ventilation, respiratory function stable and patient connected to nasal cannula oxygen Cardiovascular status: blood pressure returned to baseline and stable Postop Assessment: no apparent nausea or vomiting Anesthetic complications: no   No complications documented.   Last Vitals:  Vitals:   10/05/20 0800 10/05/20 0810  BP: 115/72 118/71  Pulse: 76 81  Resp: (!) 22 (!) 24  Temp:    SpO2: 97% 94%    Last Pain:  Vitals:   10/05/20 0712  TempSrc: Oral  PainSc: 0-No pain                 Yevette Edwards

## 2021-02-13 ENCOUNTER — Emergency Department
Admission: EM | Admit: 2021-02-13 | Discharge: 2021-02-13 | Disposition: A | Discharge status: Home / Self Care | Payer: Commercial Managed Care - PPO | Attending: Emergency Medicine | Admitting: Emergency Medicine

## 2021-02-13 ENCOUNTER — Emergency Department: Payer: Commercial Managed Care - PPO

## 2021-02-13 ENCOUNTER — Other Ambulatory Visit: Payer: Self-pay

## 2021-02-13 DIAGNOSIS — S06310A Contusion and laceration of right cerebrum without loss of consciousness, initial encounter: Secondary | ICD-10-CM

## 2021-02-13 DIAGNOSIS — W228XXA Striking against or struck by other objects, initial encounter: Secondary | ICD-10-CM | POA: Diagnosis not present

## 2021-02-13 DIAGNOSIS — Y92512 Supermarket, store or market as the place of occurrence of the external cause: Secondary | ICD-10-CM | POA: Diagnosis not present

## 2021-02-13 DIAGNOSIS — J45909 Unspecified asthma, uncomplicated: Secondary | ICD-10-CM | POA: Insufficient documentation

## 2021-02-13 DIAGNOSIS — I10 Essential (primary) hypertension: Secondary | ICD-10-CM | POA: Insufficient documentation

## 2021-02-13 DIAGNOSIS — Z7901 Long term (current) use of anticoagulants: Secondary | ICD-10-CM | POA: Diagnosis not present

## 2021-02-13 DIAGNOSIS — Z79899 Other long term (current) drug therapy: Secondary | ICD-10-CM | POA: Insufficient documentation

## 2021-02-13 DIAGNOSIS — S06340A Traumatic hemorrhage of right cerebrum without loss of consciousness, initial encounter: Secondary | ICD-10-CM | POA: Diagnosis not present

## 2021-02-13 DIAGNOSIS — Z87891 Personal history of nicotine dependence: Secondary | ICD-10-CM | POA: Diagnosis not present

## 2021-02-13 DIAGNOSIS — S0990XA Unspecified injury of head, initial encounter: Secondary | ICD-10-CM | POA: Diagnosis present

## 2021-02-13 DIAGNOSIS — R55 Syncope and collapse: Secondary | ICD-10-CM

## 2021-02-13 LAB — BASIC METABOLIC PANEL
Anion gap: 11 (ref 5–15)
BUN: 21 mg/dL (ref 8–23)
CO2: 29 mmol/L (ref 22–32)
Calcium: 9.4 mg/dL (ref 8.9–10.3)
Chloride: 96 mmol/L — ABNORMAL LOW (ref 98–111)
Creatinine, Ser: 0.82 mg/dL (ref 0.61–1.24)
GFR, Estimated: >60 mL/min (ref >60)
Glucose, Bld: 113 mg/dL — ABNORMAL HIGH (ref 70–99)
Potassium: 3.7 mmol/L (ref 3.5–5.1)
Sodium: 136 mmol/L (ref 135–145)

## 2021-02-13 LAB — CBC
HCT: 46.1 % (ref 39.0–52.0)
Hemoglobin: 15.6 g/dL (ref 13.0–17.0)
MCH: 31 pg (ref 26.0–34.0)
MCHC: 33.8 g/dL (ref 30.0–36.0)
MCV: 91.7 fL (ref 80.0–100.0)
Platelets: 198 10*3/uL (ref 150–400)
RBC: 5.03 MIL/uL (ref 4.22–5.81)
RDW: 13.9 % (ref 11.5–15.5)
WBC: 8.2 10*3/uL (ref 4.0–10.5)
nRBC: 0 % (ref 0.0–0.2)

## 2021-02-13 LAB — TROPONIN I (HIGH SENSITIVITY): Troponin I (High Sensitivity): 12 ng/L (ref <18)

## 2021-02-13 LAB — CBG MONITORING, ED: Glucose-Capillary: 105 mg/dL — ABNORMAL HIGH (ref 70–99)

## 2021-02-13 MED ORDER — DILTIAZEM HCL ER COATED BEADS 120 MG PO CP24: 120.0000 mg | ORAL_CAPSULE | Freq: Every day | ORAL | 0 refills | Status: AC

## 2021-02-13 MED ORDER — METOPROLOL TARTRATE 25 MG PO TABS: 25.0000 mg | ORAL_TABLET | Freq: Two times a day (BID) | ORAL | 0 refills | Status: AC

## 2021-02-13 MED ORDER — ALBUTEROL SULFATE HFA 108 (90 BASE) MCG/ACT IN AERS: 2.0000 | INHALATION_SPRAY | Freq: Four times a day (QID) | RESPIRATORY_TRACT | 0 refills | Status: AC | PRN

## 2021-02-13 NOTE — ED Notes (Signed)
Pt given water and crackers per verbal ok from Dr Larinda Buttery.

## 2021-02-13 NOTE — ED Notes (Signed)
Pt undressed and placed in gown. 

## 2021-02-13 NOTE — ED Notes (Signed)
ET provider at bedside.-

## 2021-02-13 NOTE — Discharge Instructions (Addendum)
Please stop taking your Eliquis, aspirin, or any NSAIDs such as ibuprofen or naproxen.  You will need to follow-up with neurosurgery in 10 to 14 days but please return to the ER sooner than that if you have worsening headache or other neurologic symptoms.  You should also schedule follow-up with your cardiologist for evaluation due to your passing out episode today.  Please return to the ER for reevaluation if you have any further passing out episodes.  You will need to decrease your dose of diltiazem and metoprolol in half as your low heart rate today could have contributed to your passing out.

## 2021-02-13 NOTE — ED Notes (Signed)
Pt reports falling aprox 2 days ago, he states he passed out and fell and hit his head. He reports headache, nausea, vomiting, dizziness occurring over the last couple of days and that "these symptoms have mostly resolved as of today". C/o a mild headache at present but denies other symptoms.

## 2021-02-13 NOTE — ED Triage Notes (Signed)
First nurse Note:  Arrives from [facility] for ED evaluation for fall in [employer] 2 days ago.  No LOC.  Patient takes blood thinners.  AAOx3.  Skin warm and dry. NAD

## 2021-02-13 NOTE — ED Provider Notes (Signed)
[facility] Emergency Department Provider Note   ____________________________________________   Event Date/Time   First MD Initiated Contact with Patient 02/13/21 1307     (approximate)  I have reviewed the triage vital signs and the nursing notes.   HISTORY  Chief Complaint Loss of Consciousness    HPI Gerald Garcia is a 70 y.o. male with past med history of hypertension, asthma, and atrial fibrillation on Eliquis who presents to the ED complaining of headache.  Patient reports that he passed out and fell in [place] 6 days ago.  He reports striking his head but is adamant that he lost consciousness prior to hitting his head.  He denies any pain in his chest or difficulty breathing associated with the episode.  He had been he dealing with pain around both of his temples for a couple of days after the fall, states that his headache now seems to be improving.  He denies any vision changes, speech changes, numbness, or weakness.  He does not take Eliquis for his atrial fibrillation.  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        Past Medical History:  Diagnosis Date   Asthma    Hypertension     There are no problems to display for this patient.   Past Surgical History:  Procedure Laterality Date   CARDIOVERSION N/A 10/05/2020   Procedure: CARDIOVERSION;  Surgeon: Dalia Heading, MD;  Location: [facility] ORS;  Service: Cardiovascular;  Laterality: N/A;   HERNIA REPAIR     x2 as infant   INGUINAL HERNIA REPAIR Left 01/20/2020   Procedure: HERNIA REPAIR INGUINAL ADULT;  Surgeon: Orson Ape, MD;  Location: [facility] ORS;  Service: Urology;  Laterality: Left;   INSERTION OF MESH Left 01/20/2020   Procedure: INSERTION OF MESH;  Surgeon: Orson Ape, MD;  Location: [facility] ORS;  Service: Urology;  Laterality: Left;    Prior to Admission medications   Medication  Sig Start Date End Date Taking? Authorizing Provider  albuterol (VENTOLIN HFA) 108 (90 Base) MCG/ACT inhaler Inhale 2 puffs into the lungs every 6 (six) hours as needed for wheezing or shortness of breath.    [provider]  BREO ELLIPTA 100-25 MCG/INH AEPB Inhale 1 puff into the lungs daily. 08/26/20   [provider]  diazepam (VALIUM) 5 MG tablet Take 5 mg by mouth daily as needed for anxiety (flying).    [provider]  diltiazem (CARDIZEM CD) 120 MG 24 hr capsule Take 250 mg by mouth 2 (two) times daily. 09/26/20   [provider]  docusate sodium (COLACE) 100 MG capsule Take 2 capsules (200 mg total) by mouth 2 (two) times daily. Patient not taking: No sig reported 01/20/20   Orson Ape, MD  ELIQUIS 5 MG TABS tablet Take 5 mg by mouth 2 (two) times daily. 09/15/20   [provider]  Ixekizumab (TALTZ) 80 MG/ML SOAJ Inject 80 mg into the skin every 30 (thirty) days.     [provider]  lisinopril (ZESTRIL) 5 MG tablet Take 5 mg by mouth daily.    [provider]  metoprolol tartrate (LOPRESSOR) 25 MG tablet Take 50 mg by mouth 2 (two) times daily. 09/21/20   [provider]  Multiple Vitamins-Minerals (MULTIVITAMIN WITH MINERALS) tablet Take 1 tablet by mouth daily.    [provider]  tadalafil (CIALIS) 5 MG tablet Take 5 mg by mouth daily.    [provider]    Allergies Penicillins  No family history on file.  Social History Social History   Tobacco Use   Smoking status: Former   Smokeless tobacco: Never  Building services engineer Use: Never used  Substance Use Topics   Alcohol use: Yes    Comment: occ   Drug use: Never    Review of Systems  Constitutional: No fever/chills Eyes: No visual changes. ENT: No sore throat. Cardiovascular: Denies chest pain. Respiratory: Denies shortness of breath. Gastrointestinal: No abdominal pain.  No nausea, no vomiting.  No diarrhea.  No  constipation. Genitourinary: Negative for dysuria. Musculoskeletal: Negative for back pain. Skin: Negative for rash. Neurological: Positive for headaches, negative for focal weakness or numbness.  ____________________________________________   PHYSICAL EXAM:  VITAL SIGNS: ED Triage Vitals  Enc Vitals Group     BP 02/13/21 1134 137/64     Pulse Rate 02/13/21 1134 (!) 45     Resp 02/13/21 1134 18     Temp 02/13/21 1134 100.2 F (37.9 C)     Temp Source 02/13/21 1134 Oral     SpO2 02/13/21 1134 95 %     Weight 02/13/21 1135 180 lb (81.6 kg)     Height 02/13/21 1135 6\' 2"  (1.88 m)     Head Circumference --      Peak Flow --      Pain Score 02/13/21 1135 1     Pain Loc --      Pain Edu? --      Excl. in GC? --     Constitutional: Alert and oriented. Eyes: Conjunctivae are normal.  Pupils equal, round, and reactive to light bilaterally. Head: Atraumatic. Nose: No congestion/rhinnorhea. Mouth/Throat: Mucous membranes are moist. Neck: Normal ROM, no midline cervical spine tenderness to palpation. Cardiovascular: Bradycardic, regular rhythm. Grossly normal heart sounds.  2+ radial pulses bilaterally. Respiratory: Normal respiratory effort.  No retractions. Lungs CTAB. Gastrointestinal: Soft and nontender. No distention. Genitourinary: deferred Musculoskeletal: No lower extremity tenderness nor edema. Neurologic:  Normal speech and language. No gross focal neurologic deficits are appreciated. Skin:  Skin is warm, dry and intact. No rash noted. Psychiatric: Mood and affect are normal. Speech and behavior are normal.  ____________________________________________   LABS (all labs ordered are listed, but only abnormal results are displayed)  Labs Reviewed  BASIC METABOLIC PANEL - Abnormal; Notable for the following components:      Result Value   Chloride 96 (*)    Glucose, Bld 113 (*)    All other components within normal limits  CBG MONITORING, ED - Abnormal; Notable for  the following components:   Glucose-Capillary 105 (*)    All other components within normal limits  CBC  URINALYSIS, COMPLETE (UACMP) WITH MICROSCOPIC  TROPONIN I (HIGH SENSITIVITY)   ____________________________________________  EKG  ED ECG REPORT I, 04/15/21, the attending physician, personally viewed and interpreted this ECG.   Date: 02/13/2021  EKG Time: 11:37  Rate: 47  Rhythm: sinus bradycardia  Axis: Normal  Intervals:none  ST&T Change: None   PROCEDURES  Procedure(s) performed (including Critical Care):  Procedures   ____________________________________________   INITIAL IMPRESSION / ASSESSMENT AND PLAN / ED COURSE      70 year old male with past medical history of hypertension, asthma, and atrial fibrillation on Eliquis who presents to the ED following syncope and fall 6 days ago, where he struck his head.  He now reports ongoing headache but has no focal neurologic deficits on exam.  CT head was performed and I was notified by radiology that patient has intraparenchymal hemorrhage in his right temporal lobe with mild mass-effect along with possible left-sided small subdural hematoma.  We will discuss these findings with neurosurgery.  Syncopal work-up has been unremarkable thus far, EKG shows no evidence of arrhythmia or ischemia, labs are unremarkable.  CT findings discussed with Dr. Adriana Simas from neurosurgery, who recommends observation and follow-up CT scan in 6 hours to ensure stability.  No surgical intervention needed at this time but patient will need to hold Eliquis and aspirin or any other blood thinner.  He may follow-up with neurosurgery in 10 to 14 days if repeat CT is stable.  Repeat CT head is grossly stable compared to previous, patient remains relatively asymptomatic here in the ED.  He is appropriate for follow-up as an outpatient with neurosurgery and cardiology.  He was counseled to have his doses of diltiazem and metoprolol due to bradycardia  here in the ED.  He was also counseled to stop Eliquis and avoid any NSAIDs or aspirin.  He was counseled to return to the ED for new worsening symptoms, patient agrees with plan.      ____________________________________________   FINAL CLINICAL IMPRESSION(S) / ED DIAGNOSES  Final diagnoses:  Intraparenchymal hematoma of brain, right, without loss of consciousness, initial encounter [facility])  Syncope and collapse     ED Discharge Orders     None        Note:  This document was prepared using Dragon voice recognition software and may include unintentional dictation errors.    Chesley Noon, MD 02/13/21 2018

## 2021-02-13 NOTE — ED Triage Notes (Signed)
Pt here with LOC on Wed when he was out shopping and passed out and hit his head on the floor. Pt reports headache and nausea. Pt believes that he has had a concussion. Pt denies pain, except the headache.

## 2021-02-14 ENCOUNTER — Other Ambulatory Visit: Payer: Self-pay | Admitting: Neurosurgery

## 2021-02-14 DIAGNOSIS — S065X9A Traumatic subdural hemorrhage with loss of consciousness of unspecified duration, initial encounter: Secondary | ICD-10-CM

## 2021-02-14 DIAGNOSIS — S065XAA Traumatic subdural hemorrhage with loss of consciousness status unknown, initial encounter: Secondary | ICD-10-CM

## 2021-02-14 NOTE — Consult Note (Signed)
Neurosurgery-New Consultation Evaluation 02/14/2021 Gerald Garcia [id number]  Identifying Statement: Gerald Garcia is a 70 y.o. male from [facility] [place] [id number] with recent fall  Physician Requesting Consultation: [facility] ED  History of Present Illness: Gerald Garcia is here for evaluation after a fall last week and states he has some headache. He did lose consciousness with the fall but denies any other symptoms. He has not had any speech problems, weakness, numbness, or other symptoms. He is on elquis and has taken aspirin for the headache. In the ED, CT of the head showed some IPH in right temporal lobe and mild SDH.  Past Medical History:  Past Medical History:  Diagnosis Date   Asthma    Hypertension     Social History: Social History   Socioeconomic History   Marital status: Single    Spouse name: Not on file   Number of children: Not on file   Years of education: Not on file   Highest education level: Not on file  Occupational History   Not on file  Tobacco Use   Smoking status: Former   Smokeless tobacco: Never  Vaping Use   Vaping Use: Never used  Substance and Sexual Activity   Alcohol use: Yes    Comment: occ   Drug use: Never   Sexual activity: Not on file  Other Topics Concern   Not on file  Social History Narrative   Not on file   Social Determinants of Health   Financial Resource Strain: Not on file  Food Insecurity: Not on file  Transportation Needs: Not on file  Physical Activity: Not on file  Stress: Not on file  Social Connections: Not on file  Intimate Partner Violence: Not on file    Family History: No family history on file.  Review of Systems:  Review of Systems - General ROS: Negative Psychological ROS: Negative Ophthalmic ROS: Negative ENT ROS: Negative Hematological and Lymphatic ROS: Negative  Endocrine ROS: Negative Respiratory ROS: Negative Cardiovascular ROS: Negative Gastrointestinal ROS: Negative Genito-Urinary  ROS: Negative Musculoskeletal ROS: Negative Neurological ROS: Positive for headache Dermatological ROS: Negative  Physical Exam: BP 131/71   Pulse (!) 59   Temp 100.2 F (37.9 C) (Oral)   Resp 18   Ht 6\' 2"  (1.88 m)   Wt 81.6 kg   SpO2 99%   BMI 23.11 kg/m  Body mass index is 23.11 kg/m. Body surface area is 2.06 meters squared. General appearance: Alert, cooperative, in no acute distress Head: Normocephalic Eyes: Normal, EOM intact Oropharynx: Moist without lesions Ext: Warm extremities  Neurologic exam:  Mental status: alertness: alert, orientation: person, place, time, affect: normal Speech: fluent and clear, naming and repetition intact Cranial nerves:  II: Visual fields are full by confrontation, no ptosis III/IV/VI: extra-ocular motions intact bilaterally V/VII:no evidence of facial droop or weakness VIII: hearing normal XI: trapezius strength symmetric,  sternocleidomastoid strength symmetric XII: tongue strength symmetric  Motor:strength symmetric 5/5, normal muscle mass and tone in all extremities  Sensory: intact to light touch in all extremities Gait: not tested  Laboratory: Results for orders placed or performed during the hospital encounter of 02/13/21  Basic metabolic panel  Result Value Ref Range   Sodium 136 135 - 145 mmol/L   Potassium 3.7 3.5 - 5.1 mmol/L   Chloride 96 (L) 98 - 111 mmol/L   CO2 29 22 - 32 mmol/L   Glucose, Bld 113 (H) 70 - 99 mg/dL   BUN 21 8 - 23 mg/dL  Creatinine, Ser 0.82 0.61 - 1.24 mg/dL   Calcium 9.4 8.9 - 56.4 mg/dL   GFR, Estimated >33 >29 mL/min   Anion gap 11 5 - 15  CBC  Result Value Ref Range   WBC 8.2 4.0 - 10.5 K/uL   RBC 5.03 4.22 - 5.81 MIL/uL   Hemoglobin 15.6 13.0 - 17.0 g/dL   HCT 51.8 84.1 - 66.0 %   MCV 91.7 80.0 - 100.0 fL   MCH 31.0 26.0 - 34.0 pg   MCHC 33.8 30.0 - 36.0 g/dL   RDW 63.0 16.0 - 10.9 %   Platelets 198 150 - 400 K/uL   nRBC 0.0 0.0 - 0.2 %  CBG monitoring, ED  Result Value Ref  Range   Glucose-Capillary 105 (H) 70 - 99 mg/dL  Troponin I (High Sensitivity)  Result Value Ref Range   Troponin I (High Sensitivity) 12 <18 ng/L   I personally reviewed labs  Imaging: CT Head: 1. Grossly stable acute 2.4 cm intraparenchymal hematoma/contusion within the right temporal lobe with surrounding edema and overlying 5 mm thick right convexity extra-axial hemorrhage. Similar small amount of extra-axial blood along the right tentorium and posterior falx. 2. Similar low-density 6 mm thick left convexity subdural collection   Impression/Plan:  Gerald Garcia is here with now CT scan showing stable IPH and SDH after trauma last week. Given the stability and normal exam, we recommend staying off Eliquis and aspirin for 2 weeks and then follow up with CT in clinic.    1.  Diagnosis: Traumatic IPH and SDH  2.  Plan - Follow up 2 weeks with CT - No eliquis or ASA

## 2021-02-28 ENCOUNTER — Ambulatory Visit
Admission: RE | Admit: 2021-02-28 | Discharge: 2021-02-28 | Disposition: A | Discharge status: Home / Self Care | Payer: Commercial Managed Care - PPO | Source: Ambulatory Visit | Attending: Neurosurgery | Admitting: Neurosurgery

## 2021-02-28 ENCOUNTER — Other Ambulatory Visit: Payer: Self-pay

## 2021-02-28 DIAGNOSIS — S065X9A Traumatic subdural hemorrhage with loss of consciousness of unspecified duration, initial encounter: Secondary | ICD-10-CM | POA: Insufficient documentation

## 2021-02-28 DIAGNOSIS — S065XAA Traumatic subdural hemorrhage with loss of consciousness status unknown, initial encounter: Secondary | ICD-10-CM

## 2021-06-12 ENCOUNTER — Ambulatory Visit (LOCAL_COMMUNITY_HEALTH_CENTER): Payer: Self-pay

## 2021-06-12 ENCOUNTER — Other Ambulatory Visit: Payer: Self-pay

## 2021-06-12 DIAGNOSIS — Z23 Encounter for immunization: Secondary | ICD-10-CM

## 2021-06-12 DIAGNOSIS — Z719 Counseling, unspecified: Secondary | ICD-10-CM

## 2021-06-12 NOTE — Progress Notes (Signed)
1.Have you been close physical contact with someone diagnosed with monkeypox in the last 14 days? No   2. For men who have sex with me (MSM), or transgender individuals, did you in the last 90 days:               1. Have multiple or anonymous sex partners? No               2. Receive a diagnosis of a sexually transmitted infection?No               3. Receive HIV pre-exposure prophylaxis (PrEP)?Yes   3. Are you having any symptoms such as a new rash, lesions, or bumps?No   4. Do you have any allergies to egg protein, gentamicin, ciprofloxacin?No   5. Do you currently take Deflazacort (Calcort)?No   6. Have you had a previous dose of Smallpox vaccine in the last 3 years?No   7. Do you have a history of keloid scarring?No if yes- patient will need subcutaneous placement in posterior arm   8. Jynneos vaccine is a two-dose vaccine series.  Is this your first or second dose of Jynneos? This is the patient's second dose  Patient in nurse clinic for Jynneos 2nd Dose. Administered by Ferrel Logan, LPN. Patient tolerated well. [facility] updated and copy given to patient. Ann Held, RN

## 2022-01-24 ENCOUNTER — Other Ambulatory Visit: Payer: Self-pay | Admitting: Pulmonary Disease

## 2022-01-24 DIAGNOSIS — I2699 Other pulmonary embolism without acute cor pulmonale: Secondary | ICD-10-CM

## 2022-01-31 ENCOUNTER — Ambulatory Visit
Admission: RE | Admit: 2022-01-31 | Discharge: 2022-01-31 | Disposition: A | Discharge status: Home / Self Care | Payer: Commercial Managed Care - PPO | Source: Ambulatory Visit | Attending: Pulmonary Disease | Admitting: Pulmonary Disease

## 2022-01-31 DIAGNOSIS — I2699 Other pulmonary embolism without acute cor pulmonale: Secondary | ICD-10-CM

## 2022-01-31 MED ORDER — IOPAMIDOL (ISOVUE-370) INJECTION 76%
75.0000 mL | Freq: Once | INTRAVENOUS | Status: AC | PRN
  Administered 2022-01-31: 75 mL via INTRAVENOUS

## 2022-02-05 ENCOUNTER — Other Ambulatory Visit: Payer: Self-pay

## 2022-02-05 ENCOUNTER — Inpatient Hospital Stay: Payer: Commercial Managed Care - PPO

## 2022-02-05 ENCOUNTER — Inpatient Hospital Stay: Payer: Commercial Managed Care - PPO | Attending: Oncology | Admitting: Oncology

## 2022-02-05 ENCOUNTER — Encounter: Payer: Self-pay | Admitting: Oncology

## 2022-02-05 VITALS — BP 142/67 | HR 53 | Temp 97.2°F | Ht 74.0 in | Wt 172.0 lb

## 2022-02-05 DIAGNOSIS — I2699 Other pulmonary embolism without acute cor pulmonale: Secondary | ICD-10-CM

## 2022-02-05 DIAGNOSIS — Z79624 Long term (current) use of inhibitors of nucleotide synthesis: Secondary | ICD-10-CM | POA: Insufficient documentation

## 2022-02-05 DIAGNOSIS — Z87891 Personal history of nicotine dependence: Secondary | ICD-10-CM | POA: Diagnosis not present

## 2022-02-05 DIAGNOSIS — Z79899 Other long term (current) drug therapy: Secondary | ICD-10-CM | POA: Diagnosis not present

## 2022-02-05 DIAGNOSIS — Z7951 Long term (current) use of inhaled steroids: Secondary | ICD-10-CM | POA: Insufficient documentation

## 2022-02-05 DIAGNOSIS — I110 Hypertensive heart disease with heart failure: Secondary | ICD-10-CM | POA: Insufficient documentation

## 2022-02-05 DIAGNOSIS — I509 Heart failure, unspecified: Secondary | ICD-10-CM | POA: Insufficient documentation

## 2022-02-05 DIAGNOSIS — R911 Solitary pulmonary nodule: Secondary | ICD-10-CM

## 2022-02-05 DIAGNOSIS — Z7901 Long term (current) use of anticoagulants: Secondary | ICD-10-CM | POA: Diagnosis not present

## 2022-02-05 LAB — COMPREHENSIVE METABOLIC PANEL
ALT: 20 U/L (ref 0–44)
AST: 18 U/L (ref 15–41)
Albumin: 4.6 g/dL (ref 3.5–5.0)
Alkaline Phosphatase: 38 U/L (ref 38–126)
Anion gap: 7 (ref 5–15)
BUN: 22 mg/dL (ref 8–23)
CO2: 29 mmol/L (ref 22–32)
Calcium: 9.4 mg/dL (ref 8.9–10.3)
Chloride: 101 mmol/L (ref 98–111)
Creatinine, Ser: 0.88 mg/dL (ref 0.61–1.24)
GFR, Estimated: >60 mL/min (ref >60)
Glucose, Bld: 107 mg/dL — ABNORMAL HIGH (ref 70–99)
Potassium: 4.1 mmol/L (ref 3.5–5.1)
Sodium: 137 mmol/L (ref 135–145)
Total Bilirubin: 0.4 mg/dL (ref 0.3–1.2)
Total Protein: 7.8 g/dL (ref 6.5–8.1)

## 2022-02-05 LAB — CBC WITH DIFFERENTIAL/PLATELET
Abs Immature Granulocytes: 0.01 10*3/uL (ref 0.00–0.07)
Basophils Absolute: 0 10*3/uL (ref 0.0–0.1)
Basophils Relative: 1 %
Eosinophils Absolute: 0.1 10*3/uL (ref 0.0–0.5)
Eosinophils Relative: 1 %
HCT: 43.4 % (ref 39.0–52.0)
Hemoglobin: 14 g/dL (ref 13.0–17.0)
Immature Granulocytes: 0 %
Lymphocytes Relative: 27 %
Lymphs Abs: 1.1 10*3/uL (ref 0.7–4.0)
MCH: 32.9 pg (ref 26.0–34.0)
MCHC: 32.3 g/dL (ref 30.0–36.0)
MCV: 101.9 fL — ABNORMAL HIGH (ref 80.0–100.0)
Monocytes Absolute: 0.3 10*3/uL (ref 0.1–1.0)
Monocytes Relative: 7 %
Neutro Abs: 2.7 10*3/uL (ref 1.7–7.7)
Neutrophils Relative %: 64 %
Platelets: 156 10*3/uL (ref 150–400)
RBC: 4.26 MIL/uL (ref 4.22–5.81)
RDW: 12.9 % (ref 11.5–15.5)
WBC: 4.2 10*3/uL (ref 4.0–10.5)
nRBC: 0 % (ref 0.0–0.2)

## 2022-02-05 LAB — ANTITHROMBIN III: AntiThromb III Func: 105 % (ref 75–120)

## 2022-02-05 MED ORDER — RIVAROXABAN (XARELTO) VTE STARTER PACK (15 & 20 MG): ORAL_TABLET | ORAL | 0 refills | Status: DC

## 2022-02-05 NOTE — Assessment & Plan Note (Signed)
Repeat CT in 6 to 12 months.

## 2022-02-05 NOTE — Progress Notes (Signed)
Hematology/Oncology Consult note [phone] [phone]         Patient Care Team: [name], MD as PCP - General (Internal Medicine)  REFERRING PROVIDER: Vida Rigger, MD   CHIEF COMPLAINTS/REASON FOR VISIT:  Evaluation of pulmonary embolism   HISTORY OF PRESENTING ILLNESS:   Gerald Garcia is a  71 y.o.  male with PMH listed below was seen in consultation at the request of  Vida Rigger, MD  for evaluation of pulmonary embolism.   Patient is on chronic treatment with Eliquis 5 mg twice daily for atrial fibrillation.  Patient reports being compliant on the medication. He has noted no extremity swelling for about 6 months. He has had chronic shortness of breath with exertion for over a year and he follows up with pulmonology Dr. Karna Christmas.  Patient takes Lasix for CHF and chronic edema.  He also has COPD/asthma.  Despite optimizing breathing treatments, he continues to have chronic shortness of breath with ambulation.  CT was ordered for further evaluation . 01/31/2022, CT angiogram chest pulm embolism protocol showed PE identified descending left interlobar pulmonary artery and its branches, upper lobe branches of the pulmonary arteries bilaterally.  No evidence of right heart strain.  Heart size is normal.  0.7 cm right upper lobe pulmonary nodule.  Patient is a former smoker.  Patient denies any unintentional weight loss, night sweats, fever, cough, hemoptysis.  Denies any family history of VTE.  Denies any previous personal history of VTE. He is overdue for colonoscopy and he declines. He is on testosterone cream supplementation managed by urology Dr. Sheppard Penton. 07/19/2021 CBC did not show any erythrocytosis.  Instead hemoglobin was slightly decreased at 11.7. Patient was referred to establish care with hematology for further evaluation. Chronic shortness of breath with exertion.  No chest pain, hemoptysis.  In August 2022, patient passed out and hit his  brain and developed brain hematoma.  Patient had a series of CT head done.  Last CT scan on 02/28/2021 which showed right temporal lobe parenchymal hematoma shows decreased density compared to prior studies.  Improvement of the subdural hematoma.  MEDICAL HISTORY:  Past Medical History:  Diagnosis Date   Asthma    Atrial fibrillation (HCC)    Hypertension     SURGICAL HISTORY: Past Surgical History:  Procedure Laterality Date   CARDIOVERSION N/A 10/05/2020   Procedure: CARDIOVERSION;  Surgeon: Dalia Heading, MD;  Location: [facility];  Service: Cardiovascular;  Laterality: N/A;   HERNIA REPAIR     x2 as infant   INGUINAL HERNIA REPAIR Left 01/20/2020   Procedure: HERNIA REPAIR INGUINAL ADULT;  Surgeon: Orson Ape, MD;  Location: [facility];  Service: Urology;  Laterality: Left;   INSERTION OF MESH Left 01/20/2020   Procedure: INSERTION OF MESH;  Surgeon: Orson Ape, MD;  Location: [facility];  Service: Urology;  Laterality: Left;    SOCIAL HISTORY: Social History   Socioeconomic History   Marital status: Single    Spouse name: Not on file   Number of children: Not on file   Years of education: Not on file   Highest education level: Not on file  Occupational History   Not on file  Tobacco Use   Smoking status: Former   Smokeless tobacco: Never  Vaping Use   Vaping Use: Never used  Substance and Sexual Activity   Alcohol use: Yes    Comment: occ   Drug use: Never   Sexual activity: Not on file  Other  Topics Concern   Not on file  Social History Narrative   Not on file   Social Determinants of Health   Financial Resource Strain: Not on file  Food Insecurity: Not on file  Transportation Needs: Not on file  Physical Activity: Not on file  Stress: Not on file  Social Connections: Not on file  Intimate Partner Violence: Not on file    FAMILY HISTORY: History reviewed. No pertinent family history.  ALLERGIES:  is allergic to penicillins.  MEDICATIONS:   Current Outpatient Medications  Medication Sig Dispense Refill   albuterol (VENTOLIN HFA) 108 (90 Base) MCG/ACT inhaler Inhale 2 puffs into the lungs every 6 (six) hours as needed for wheezing or shortness of breath. 8 g 0   BREO ELLIPTA 100-25 MCG/INH AEPB Inhale 1 puff into the lungs daily.     diazepam (VALIUM) 5 MG tablet Take 5 mg by mouth daily as needed for anxiety (flying).     emtricitabine-tenofovir (TRUVADA) 200-300 MG tablet Take 1 tablet by mouth daily.     finasteride (PROSCAR) 5 MG tablet Take 5 mg by mouth daily.     flecainide (TAMBOCOR) 50 MG tablet TAKE 1 TABLET(50 MG) BY MOUTH TWICE DAILY     flecainide (TAMBOCOR) 50 MG tablet Take 50 mg by mouth 2 (two) times daily.     furosemide (LASIX) 40 MG tablet TAKE 1 TABLET(40 MG) BY MOUTH EVERY DAY     Ixekizumab (TALTZ) 80 MG/ML SOAJ Inject 80 mg into the skin every 30 (thirty) days.      mirtazapine (REMERON) 30 MG tablet Take 30 mg by mouth at bedtime.     Multiple Vitamins-Minerals (MULTIVITAMIN WITH MINERALS) tablet Take 1 tablet by mouth daily.     RIVAROXABAN (XARELTO) VTE STARTER PACK (15 & 20 MG) Follow package directions: Take one 15mg  tablet by mouth twice a day. On day 22, switch to one 20mg  tablet once a day. Take with food. 51 each 0   spironolactone (ALDACTONE) 25 MG tablet Take 25 mg by mouth daily.     tadalafil (CIALIS) 5 MG tablet Take 5 mg by mouth daily.     zolpidem (AMBIEN) 10 MG tablet Take 10 mg by mouth at bedtime.     diltiazem (CARDIZEM CD) 120 MG 24 hr capsule Take 1 capsule (120 mg total) by mouth daily. 30 capsule 0   docusate sodium (COLACE) 100 MG capsule Take 2 capsules (200 mg total) by mouth 2 (two) times daily. (Patient not taking: Reported on 09/28/2020) 120 capsule 3   lisinopril (ZESTRIL) 5 MG tablet Take 5 mg by mouth daily. (Patient not taking: Reported on 02/05/2022)     metoprolol tartrate (LOPRESSOR) 25 MG tablet Take 1 tablet (25 mg total) by mouth 2 (two) times daily. 60 tablet 0   No  current facility-administered medications for this visit.    Review of Systems  Constitutional:  Negative for appetite change, chills, fatigue, fever and unexpected weight change.  HENT:   Negative for hearing loss and voice change.   Eyes:  Negative for eye problems and icterus.  Respiratory:  Positive for shortness of breath. Negative for chest tightness and cough.   Cardiovascular:  Positive for leg swelling. Negative for chest pain.  Gastrointestinal:  Negative for abdominal distention and abdominal pain.  Endocrine: Negative for hot flashes.  Genitourinary:  Negative for difficulty urinating, dysuria and frequency.   Musculoskeletal:  Negative for arthralgias.  Skin:  Negative for itching and rash.  Neurological:  Negative  for light-headedness and numbness.  Hematological:  Negative for adenopathy. Does not bruise/bleed easily.  Psychiatric/Behavioral:  Negative for confusion.    PHYSICAL EXAMINATION: ECOG PERFORMANCE STATUS: 1 - Symptomatic but completely ambulatory Vitals:   02/05/22 0908  BP: (!) 142/67  Pulse: (!) 53  Temp: (!) 97.2 F (36.2 C)  SpO2: 96%   Filed Weights   02/05/22 0908  Weight: 172 lb (78 kg)    Physical Exam Constitutional:      General: He is not in acute distress. HENT:     Head: Normocephalic and atraumatic.  Eyes:     General: No scleral icterus. Cardiovascular:     Rate and Rhythm: Normal rate and regular rhythm.     Heart sounds: Normal heart sounds.  Pulmonary:     Effort: Pulmonary effort is normal. No respiratory distress.     Breath sounds: No wheezing.  Abdominal:     General: Bowel sounds are normal. There is no distension.     Palpations: Abdomen is soft.  Musculoskeletal:        General: No deformity. Normal range of motion.     Cervical back: Normal range of motion and neck supple.  Skin:    General: Skin is warm and dry.     Findings: No erythema or rash.  Neurological:     Mental Status: He is alert and oriented to  person, place, and time. Mental status is at baseline.     Cranial Nerves: No cranial nerve deficit.  Psychiatric:        Mood and Affect: Mood normal.     LABORATORY DATA:  I have reviewed the data as listed    Latest Ref Rng & Units 02/05/2022    9:57 AM 02/13/2021   11:38 AM  CBC  WBC 4.0 - 10.5 K/uL 4.2  8.2   Hemoglobin 13.0 - 17.0 g/dL 74.2  59.5   Hematocrit 39.0 - 52.0 % 43.4  46.1   Platelets 150 - 400 K/uL 156  198       Latest Ref Rng & Units 02/05/2022    9:57 AM 02/13/2021   11:38 AM  CMP  Glucose 70 - 99 mg/dL 638  756   BUN 8 - 23 mg/dL 22  21   Creatinine 4.33 - 1.24 mg/dL 2.95  1.88   Sodium 416 - 145 mmol/L 137  136   Potassium 3.5 - 5.1 mmol/L 4.1  3.7   Chloride 98 - 111 mmol/L 101  96   CO2 22 - 32 mmol/L 29  29   Calcium 8.9 - 10.3 mg/dL 9.4  9.4   Total Protein 6.5 - 8.1 g/dL 7.8    Total Bilirubin 0.3 - 1.2 mg/dL 0.4    Alkaline Phos 38 - 126 U/L 38    AST 15 - 41 U/L 18    ALT 0 - 44 U/L 20        RADIOGRAPHIC STUDIES: I have personally reviewed the radiological images as listed and agreed with the findings in the report. CT Angio Chest Pulmonary Embolism (PE) W or WO Contrast  Result Date: 01/31/2022 CLINICAL DATA:  Chronic shortness of breath. EXAM: CT ANGIOGRAPHY CHEST WITH CONTRAST TECHNIQUE: Multidetector CT imaging of the chest was performed using the standard protocol during bolus administration of intravenous contrast. Multiplanar CT image reconstructions and MIPs were obtained to evaluate the vascular anatomy. RADIATION DOSE REDUCTION: This exam was performed according to the departmental dose-optimization program which includes automated exposure control, adjustment  of the mA and/or kV according to patient size and/or use of iterative reconstruction technique. CONTRAST:  75 mL ISOVUE-370 IOPAMIDOL (ISOVUE-370) INJECTION 76% COMPARISON:  PA and lateral chest 06/18/2011. FINDINGS: Cardiovascular: Pulmonary embolus is identified in the descending  left interlobar pulmonary artery and its branches. Clot is also seen in upper lobe branches of the pulmonary arteries bilaterally. There is no evidence right heart strain. Heart size is normal. Mediastinum/Nodes: No enlarged mediastinal, hilar, or axillary lymph nodes. Thyroid gland, trachea, and esophagus demonstrate no significant findings. Lungs/Pleura: The lungs are emphysematous. There is right upper lobe pulmonary nodule measuring 0.7 cm in diameter on image 35 of series 5. The lungs are otherwise clear. Upper Abdomen: Simple cyst upper pole right kidney is noted. Otherwise negative. Musculoskeletal: No acute or focal abnormality. Review of the MIP images confirms the above findings. IMPRESSION: The exam is positive for pulmonary embolus without evidence of right heart strain. 0.7 cm right upper lobe pulmonary nodule. Per Fleischner society guidelines, recommend a noncontrast chest CT at 6-12 months, then another noncontrast chest CT at 18-24 months. These results were called by telephone at the time of interpretation on 01/31/2022 at 12:02 pm to provider FUAD ALESKEROV , who verbally acknowledged these results. Aortic Atherosclerosis (ICD10-I70.0) and Emphysema (ICD10-J43.9). Electronically Signed   By: Drusilla Kanner M.D.   On: 01/31/2022 12:03       ASSESSMENT & PLAN:   Pulmonary embolus (HCC) pulmonary embolism despite being compliant on Eliquis 5 mg twice daily. Indicating Eliquis daily. I recommend patient to stop Eliquis and switch to Xarelto 15 mg twice daily for 21 days followed by Xarelto 20 mg daily.  Starting.  Prescription sent to pharmacy. Patient continue side effects were reviewed and discussed with patient. Check hypercoagulable work-up: Antiphospholipid syndrome, beta glycoprotein antibodies, PNH, factor V Leiden mutation, prothrombin gene mutation, Antithrombin , protein S and C levels, JAK2 mutation with reflex. Check bilateral lower extremity ultrasound.  Lung nodule Repeat  CT in 6 to 12 months.  Discussed with patient about health maintenance cancer screening.  Patient is overdue for colonoscopy and he declines. Orders Placed This Encounter  Procedures   US Venous Img Lower Bilateral    Standing Status:   Future    Standing Expiration Date:   02/05/2023    Order Specific Question:   Reason for Exam (SYMPTOM  OR DIAGNOSIS REQUIRED)    Answer:   pain, rule out DVT, history of PE    Order Specific Question:   Preferred imaging location?    Answer:   [facility]   ANTIPHOSPHOLIPID SYNDROME PROF    Standing Status:   Future    Number of Occurrences:   1    Standing Expiration Date:   02/06/2023   Factor 5 leiden    Standing Status:   Future    Number of Occurrences:   1    Standing Expiration Date:   02/06/2023   PNH Profile (-High Sensitivity)    Standing Status:   Future    Number of Occurrences:   1    Standing Expiration Date:   02/06/2023   Prothrombin gene mutation    Standing Status:   Future    Number of Occurrences:   1    Standing Expiration Date:   02/06/2023   Antithrombin III    Standing Status:   Future    Number of Occurrences:   1    Standing Expiration Date:   02/06/2023   Protein S, total and  free    Standing Status:   Future    Number of Occurrences:   1    Standing Expiration Date:   02/06/2023   Protein C activity    Standing Status:   Future    Number of Occurrences:   1    Standing Expiration Date:   02/06/2023   Beta-2-glycoprotein i abs, IgG/M/A    Standing Status:   Future    Number of Occurrences:   1    Standing Expiration Date:   02/06/2023   CBC with Differential/Platelet    Standing Status:   Future    Number of Occurrences:   1    Standing Expiration Date:   02/06/2023   Comprehensive metabolic panel    Standing Status:   Future    Number of Occurrences:   1    Standing Expiration Date:   02/05/2023   JAK2 V617F rfx CALR/MPL/E12-15    Standing Status:   Future    Number of Occurrences:   1    Standing Expiration Date:    02/06/2023   Patient will follow-up in 3 months or earlier if indicated. All questions were answered. The patient knows to call the clinic with any problems, questions or concerns. cc Vida RiggerAleskerov, Fuad, MD    Thank you for this kind referral and the opportunity to participate in the care of this patient. A copy of today's note is routed to referring provider   Rickard PatienceZhou Zaine Elsass, MD, PhD [facility] 02/05/2022

## 2022-02-05 NOTE — Assessment & Plan Note (Addendum)
pulmonary embolism despite being compliant on Eliquis 5 mg twice daily. Indicating Eliquis daily. I recommend patient to stop Eliquis and switch to Xarelto 15 mg twice daily for 21 days followed by Xarelto 20 mg daily.  Starting.  Prescription sent to pharmacy. Patient continue side effects were reviewed and discussed with patient. Check hypercoagulable work-up: Antiphospholipid syndrome, beta glycoprotein antibodies, PNH, factor V Leiden mutation, prothrombin gene mutation, Antithrombin , protein S and C levels, JAK2 mutation with reflex. Check bilateral lower extremity ultrasound.

## 2022-02-06 LAB — ANTIPHOSPHOLIPID SYNDROME PROF
Anticardiolipin IgG: 11 GPL U/mL (ref 0–14)
Anticardiolipin IgM: <9 MPL U/mL (ref 0–12)
DRVVT: 61.2 s — ABNORMAL HIGH (ref 0.0–47.0)
PTT Lupus Anticoagulant: 34.5 s (ref 0.0–43.5)

## 2022-02-06 LAB — PROTEIN S, TOTAL AND FREE
Protein S Ag, Free: 66 % (ref 61–136)
Protein S Ag, Total: 75 % (ref 60–150)

## 2022-02-06 LAB — DRVVT CONFIRM: dRVVT Confirm: 1.3 ratio — ABNORMAL HIGH (ref 0.8–1.2)

## 2022-02-06 LAB — PROTEIN C ACTIVITY: Protein C Activity: 116 % (ref 73–180)

## 2022-02-06 LAB — DRVVT MIX: dRVVT Mix: 47.5 s — ABNORMAL HIGH (ref 0.0–40.4)

## 2022-02-07 LAB — BETA-2-GLYCOPROTEIN I ABS, IGG/M/A
Beta-2 Glyco I IgG: <9 GPI IgG units (ref 0–20)
Beta-2-Glycoprotein I IgA: <9 GPI IgA units (ref 0–25)
Beta-2-Glycoprotein I IgM: <9 GPI IgM units (ref 0–32)

## 2022-02-08 LAB — PNH PROFILE (-HIGH SENSITIVITY)

## 2022-02-11 LAB — JAK2 V617F RFX CALR/MPL/E12-15

## 2022-02-11 LAB — FACTOR 5 LEIDEN

## 2022-02-11 LAB — CALR +MPL + E12-E15  (REFLEX)

## 2022-02-12 ENCOUNTER — Ambulatory Visit
Admission: RE | Admit: 2022-02-12 | Discharge: 2022-02-12 | Disposition: A | Discharge status: Home / Self Care | Payer: Commercial Managed Care - PPO | Source: Ambulatory Visit | Attending: Oncology | Admitting: Oncology

## 2022-02-12 DIAGNOSIS — I2699 Other pulmonary embolism without acute cor pulmonale: Secondary | ICD-10-CM | POA: Diagnosis not present

## 2022-02-13 LAB — PROTHROMBIN GENE MUTATION

## 2022-02-19 ENCOUNTER — Telehealth: Payer: Self-pay

## 2022-02-19 ENCOUNTER — Other Ambulatory Visit: Payer: Self-pay | Admitting: Oncology

## 2022-02-19 ENCOUNTER — Other Ambulatory Visit: Payer: Self-pay

## 2022-02-19 DIAGNOSIS — I2699 Other pulmonary embolism without acute cor pulmonale: Secondary | ICD-10-CM

## 2022-02-19 MED ORDER — WARFARIN SODIUM 5 MG PO TABS: 5.0000 mg | ORAL_TABLET | Freq: Every day | ORAL | 0 refills | Status: AC

## 2022-02-19 NOTE — Telephone Encounter (Signed)
Spoke to patient and he states that he cannot come tomorrow morning for INR check. Pt will come tomorrow afternoon for lab. Advised patient to take xarelto after lab tomorrow along with coumadin and to continue xarelto and coumadin until INR is between 2-3. Pt verbalized understanding and additional lab appts have been scheduled.

## 2022-02-19 NOTE — Telephone Encounter (Signed)
Called pt and no answer. Detailed message left with ph number for pt to call us back and let us know how he would like to proceed regarding coumadin.

## 2022-02-19 NOTE — Telephone Encounter (Signed)
Spoke to patient and he is agreeable to switching to Coumadin. He states he is being followed by Cardiology Dr. Gwen Pounds for A.fib   Called Dr. Maree Krabbe office and spoke to Dr. Maree Krabbe med assistance Clydie Braun. Asked if Dr. Arlana Pouch would be able to manage Coumadin dosing or if he would need to be sent to coumadin clinic. She said she would check with Dr. Arlana Pouch and let us know.

## 2022-02-19 NOTE — Telephone Encounter (Signed)
-----   Message from [name], MD sent at 02/17/2022  2:38 PM EDT ----- I attempted to call patient on 02/17/2022 not able to reach him. Please let patient know that I reviewed his blood work.  He has lupus anticoagulant which indicates a possibility of antiphospholipid syndrome.  I recommend him to be switched to Coumadin.  If he agrees, check with Dr. Arlana Pouch for Coumadin clinic for Coumadin dosing.  I can start him on Coumadin bridging to his appointments with PCP.  Let me know.

## 2022-02-20 ENCOUNTER — Inpatient Hospital Stay: Payer: Commercial Managed Care - PPO

## 2022-02-20 DIAGNOSIS — I2699 Other pulmonary embolism without acute cor pulmonale: Secondary | ICD-10-CM

## 2022-02-20 LAB — PROTIME-INR
INR: 1.5 — ABNORMAL HIGH (ref 0.8–1.2)
Prothrombin Time: 17.9 seconds — ABNORMAL HIGH (ref 11.4–15.2)

## 2022-02-20 NOTE — Telephone Encounter (Signed)
Received call from Clydie Braun, who states that Dr. Arlana Pouch will be able to manage Coumadin. Informed her that pt has and INR check on 8/18 and 8/21. She asked for Dr. Cathie Hoops to manage first 2 INR checks and then Dr. Arlana Pouch will take over. Will fax INR results to Dr. Maree Krabbe office on 8/21.   Ph: [phone] Fax: [phone]

## 2022-02-22 ENCOUNTER — Inpatient Hospital Stay: Payer: Commercial Managed Care - PPO

## 2022-02-22 ENCOUNTER — Other Ambulatory Visit: Payer: Self-pay

## 2022-02-22 DIAGNOSIS — I2699 Other pulmonary embolism without acute cor pulmonale: Secondary | ICD-10-CM

## 2022-02-22 LAB — PROTIME-INR
INR: 1.7 — ABNORMAL HIGH (ref 0.8–1.2)
Prothrombin Time: 19.4 seconds — ABNORMAL HIGH (ref 11.4–15.2)

## 2022-02-22 NOTE — Telephone Encounter (Signed)
PT 19.4/ INR 1.7. Dr. B informed and recommends for patient to continue on Xarelto 15mg  and coumadin 5mg  as prescribed. Pt will come in on monday for INR recheck. Pt informed of recommendation

## 2022-02-25 ENCOUNTER — Inpatient Hospital Stay: Payer: Commercial Managed Care - PPO

## 2022-02-25 DIAGNOSIS — I2699 Other pulmonary embolism without acute cor pulmonale: Secondary | ICD-10-CM | POA: Diagnosis not present

## 2022-02-25 LAB — PROTIME-INR
INR: 2.1 — ABNORMAL HIGH (ref 0.8–1.2)
Prothrombin Time: 23.2 s — ABNORMAL HIGH (ref 11.4–15.2)

## 2022-02-25 NOTE — Telephone Encounter (Signed)
Per Dr. Cathie Hoops result note: Continue same regimen, repeat INR on 8/24   Pt informed. He will be going to Dr. Maree Krabbe office tomorrow. Informed him to let Dr. Maree Krabbe office know that once INR is therapeutic, we will release him to Dr. Arlana Pouch to continue to monitor coumadin. Pt verbalized understanding.

## 2022-02-26 ENCOUNTER — Inpatient Hospital Stay: Payer: Commercial Managed Care - PPO

## 2022-02-28 ENCOUNTER — Inpatient Hospital Stay: Payer: Commercial Managed Care - PPO

## 2022-02-28 DIAGNOSIS — I2699 Other pulmonary embolism without acute cor pulmonale: Secondary | ICD-10-CM

## 2022-02-28 LAB — PROTIME-INR
INR: 1.5 — ABNORMAL HIGH (ref 0.8–1.2)
Prothrombin Time: 18.2 seconds — ABNORMAL HIGH (ref 11.4–15.2)

## 2022-03-01 ENCOUNTER — Telehealth: Payer: Self-pay

## 2022-03-01 ENCOUNTER — Other Ambulatory Visit: Payer: Self-pay | Admitting: Pulmonary Disease

## 2022-03-01 ENCOUNTER — Other Ambulatory Visit: Payer: Self-pay

## 2022-03-01 DIAGNOSIS — R911 Solitary pulmonary nodule: Secondary | ICD-10-CM

## 2022-03-01 DIAGNOSIS — I2699 Other pulmonary embolism without acute cor pulmonale: Secondary | ICD-10-CM

## 2022-03-01 NOTE — Telephone Encounter (Signed)
Patient is aware of upcoming LAB appointment on 8/28 @4pm .

## 2022-03-01 NOTE — Telephone Encounter (Signed)
-----   Message from [name], MD sent at 02/28/2022  7:19 PM EDT ----- INR is subtherapeutic.  Continue coumadin 5mg  daily, continue Xarelto, he should be on 20mg  xarelto daily now. Please confirm if he has missed any coumadin dose. Thanks.

## 2022-03-01 NOTE — Telephone Encounter (Signed)
Spoke with patient and informed on recent blood work results and dr.yu recommendations. Patient verbalized he understood. Patient also states he has not missed any doses of Coumadin.

## 2022-03-04 ENCOUNTER — Other Ambulatory Visit: Payer: Commercial Managed Care - PPO

## 2022-03-04 ENCOUNTER — Inpatient Hospital Stay: Payer: Commercial Managed Care - PPO

## 2022-03-04 ENCOUNTER — Telehealth: Payer: Self-pay

## 2022-03-04 NOTE — Telephone Encounter (Signed)
Talked to pt and he will come on 8/29 @ 4p.

## 2022-03-04 NOTE — Telephone Encounter (Signed)
Pt did not show for labs today. Please call to r/s labs on 8/29.

## 2022-03-05 ENCOUNTER — Other Ambulatory Visit: Payer: Commercial Managed Care - PPO

## 2022-03-05 ENCOUNTER — Inpatient Hospital Stay: Payer: Commercial Managed Care - PPO

## 2022-03-05 DIAGNOSIS — I2699 Other pulmonary embolism without acute cor pulmonale: Secondary | ICD-10-CM

## 2022-03-05 LAB — PROTIME-INR
INR: 1.6 — ABNORMAL HIGH (ref 0.8–1.2)
Prothrombin Time: 18.4 seconds — ABNORMAL HIGH (ref 11.4–15.2)

## 2022-03-06 ENCOUNTER — Telehealth: Payer: Self-pay

## 2022-03-06 NOTE — Telephone Encounter (Signed)
Per Dr.Yu  Please Schedule a lab Only appointment For 03/08/22 earliest appointment if possible.

## 2022-03-06 NOTE — Telephone Encounter (Signed)
-----   Message from Gerald Patience, MD sent at 03/05/2022  8:54 PM EDT ----- INR is still subtherapeutic.I have called him and advise him to take 1.5 tablet [7.5mg  coumadin] daily , continue Xarelto please arrange him to repeat INR on 03/08/22, he prefers the earliest appt, 7:30AM if possible. Please let him know thanks.

## 2022-03-08 ENCOUNTER — Inpatient Hospital Stay: Payer: Commercial Managed Care - PPO | Attending: Oncology

## 2022-03-08 ENCOUNTER — Other Ambulatory Visit: Payer: Self-pay

## 2022-03-08 ENCOUNTER — Telehealth: Payer: Self-pay

## 2022-03-08 ENCOUNTER — Other Ambulatory Visit: Payer: Self-pay | Admitting: Oncology

## 2022-03-08 DIAGNOSIS — Z7901 Long term (current) use of anticoagulants: Secondary | ICD-10-CM | POA: Insufficient documentation

## 2022-03-08 DIAGNOSIS — Z86711 Personal history of pulmonary embolism: Secondary | ICD-10-CM | POA: Insufficient documentation

## 2022-03-08 DIAGNOSIS — I2699 Other pulmonary embolism without acute cor pulmonale: Secondary | ICD-10-CM

## 2022-03-08 LAB — PROTIME-INR
INR: 1.8 — ABNORMAL HIGH (ref 0.8–1.2)
Prothrombin Time: 20.5 seconds — ABNORMAL HIGH (ref 11.4–15.2)

## 2022-03-08 MED ORDER — RIVAROXABAN 20 MG PO TABS: 20.0000 mg | ORAL_TABLET | Freq: Every day | ORAL | 0 refills | Status: DC

## 2022-03-08 NOTE — Telephone Encounter (Signed)
Pt informed of MD recommendation. He is requesting refill on xarelto 20mg .   He will come for INR recheck on 9/5.

## 2022-03-08 NOTE — Telephone Encounter (Signed)
Please schedule patient for labs on 9/5 @ 8am. Pt aware of appt.

## 2022-03-08 NOTE — Telephone Encounter (Signed)
-----   Message from [name], MD sent at 03/08/2022 11:46 AM EDT ----- Continue coumadin 7.5mg  daily, and continue xarelto.  Repeat INR 9/5

## 2022-03-12 ENCOUNTER — Inpatient Hospital Stay: Payer: Commercial Managed Care - PPO

## 2022-03-12 ENCOUNTER — Telehealth: Payer: Self-pay

## 2022-03-12 DIAGNOSIS — Z86711 Personal history of pulmonary embolism: Secondary | ICD-10-CM | POA: Diagnosis not present

## 2022-03-12 DIAGNOSIS — I2699 Other pulmonary embolism without acute cor pulmonale: Secondary | ICD-10-CM

## 2022-03-12 LAB — PROTIME-INR
INR: 2.9 — ABNORMAL HIGH (ref 0.8–1.2)
Prothrombin Time: 30.1 seconds — ABNORMAL HIGH (ref 11.4–15.2)

## 2022-03-12 NOTE — Telephone Encounter (Signed)
Called and informed pt to stop taking Xarelto but continue coumadin 7.5. Pt verbalized understanding.  Level is therapeutic now, so Dr. Arlana Pouch will take over INR monitoring. Per Clydie Braun at Dr. Maree Krabbe office. Pt can go for labs on Monday morning. Pt informed.   Lab resutls faxed to Dr. Audley Hose office.

## 2022-03-14 ENCOUNTER — Other Ambulatory Visit: Payer: Self-pay | Admitting: Oncology

## 2022-05-14 ENCOUNTER — Inpatient Hospital Stay: Payer: Commercial Managed Care - PPO | Admitting: Oncology

## 2022-05-14 ENCOUNTER — Encounter: Payer: Self-pay | Admitting: Oncology

## 2022-05-14 ENCOUNTER — Inpatient Hospital Stay: Payer: Commercial Managed Care - PPO | Attending: Oncology

## 2022-05-14 VITALS — BP 135/49 | HR 47 | Temp 98.2°F | Resp 18 | Wt 186.5 lb

## 2022-05-14 DIAGNOSIS — D6862 Lupus anticoagulant syndrome: Secondary | ICD-10-CM | POA: Diagnosis not present

## 2022-05-14 DIAGNOSIS — Z87891 Personal history of nicotine dependence: Secondary | ICD-10-CM | POA: Diagnosis not present

## 2022-05-14 DIAGNOSIS — Z86711 Personal history of pulmonary embolism: Secondary | ICD-10-CM | POA: Diagnosis present

## 2022-05-14 DIAGNOSIS — J4489 Other specified chronic obstructive pulmonary disease: Secondary | ICD-10-CM | POA: Diagnosis not present

## 2022-05-14 DIAGNOSIS — I2699 Other pulmonary embolism without acute cor pulmonale: Secondary | ICD-10-CM

## 2022-05-14 DIAGNOSIS — Z79624 Long term (current) use of inhibitors of nucleotide synthesis: Secondary | ICD-10-CM | POA: Insufficient documentation

## 2022-05-14 DIAGNOSIS — I509 Heart failure, unspecified: Secondary | ICD-10-CM | POA: Insufficient documentation

## 2022-05-14 DIAGNOSIS — Z7951 Long term (current) use of inhaled steroids: Secondary | ICD-10-CM | POA: Insufficient documentation

## 2022-05-14 DIAGNOSIS — R911 Solitary pulmonary nodule: Secondary | ICD-10-CM | POA: Diagnosis not present

## 2022-05-14 DIAGNOSIS — I110 Hypertensive heart disease with heart failure: Secondary | ICD-10-CM | POA: Insufficient documentation

## 2022-05-14 DIAGNOSIS — Z79899 Other long term (current) drug therapy: Secondary | ICD-10-CM | POA: Diagnosis not present

## 2022-05-14 DIAGNOSIS — Z7901 Long term (current) use of anticoagulants: Secondary | ICD-10-CM | POA: Diagnosis not present

## 2022-05-14 LAB — COMPREHENSIVE METABOLIC PANEL
ALT: 26 U/L (ref 0–44)
AST: 23 U/L (ref 15–41)
Albumin: 4 g/dL (ref 3.5–5.0)
Alkaline Phosphatase: 39 U/L (ref 38–126)
Anion gap: 7 (ref 5–15)
BUN: 35 mg/dL — ABNORMAL HIGH (ref 8–23)
CO2: 25 mmol/L (ref 22–32)
Calcium: 9.1 mg/dL (ref 8.9–10.3)
Chloride: 106 mmol/L (ref 98–111)
Creatinine, Ser: 0.88 mg/dL (ref 0.61–1.24)
GFR, Estimated: >60 mL/min (ref >60)
Glucose, Bld: 113 mg/dL — ABNORMAL HIGH (ref 70–99)
Potassium: 4.3 mmol/L (ref 3.5–5.1)
Sodium: 138 mmol/L (ref 135–145)
Total Bilirubin: 0.3 mg/dL (ref 0.3–1.2)
Total Protein: 7.6 g/dL (ref 6.5–8.1)

## 2022-05-14 LAB — CBC WITH DIFFERENTIAL/PLATELET
Abs Immature Granulocytes: 0.02 10*3/uL (ref 0.00–0.07)
Basophils Absolute: 0 10*3/uL (ref 0.0–0.1)
Basophils Relative: 0 %
Eosinophils Absolute: 0.1 10*3/uL (ref 0.0–0.5)
Eosinophils Relative: 2 %
HCT: 43 % (ref 39.0–52.0)
Hemoglobin: 13.6 g/dL (ref 13.0–17.0)
Immature Granulocytes: 0 %
Lymphocytes Relative: 39 %
Lymphs Abs: 2 10*3/uL (ref 0.7–4.0)
MCH: 30.9 pg (ref 26.0–34.0)
MCHC: 31.6 g/dL (ref 30.0–36.0)
MCV: 97.7 fL (ref 80.0–100.0)
Monocytes Absolute: 0.5 10*3/uL (ref 0.1–1.0)
Monocytes Relative: 9 %
Neutro Abs: 2.6 10*3/uL (ref 1.7–7.7)
Neutrophils Relative %: 50 %
Platelets: 155 10*3/uL (ref 150–400)
RBC: 4.4 MIL/uL (ref 4.22–5.81)
RDW: 13 % (ref 11.5–15.5)
WBC: 5.1 10*3/uL (ref 4.0–10.5)
nRBC: 0 % (ref 0.0–0.2)

## 2022-05-14 NOTE — Progress Notes (Signed)
Hematology/Oncology Progress note [phone] [phone]            Patient Care Team: [name], MD as PCP - General (Internal Medicine)  ASSESSMENT & PLAN:   Pulmonary embolus [facility] Recommend long-term anticoagulation.  Currently is on Coumadin with INR goal of 2-3 Hyper coagulable work-up showed negative factor V Leiden mutation and prothrombin gene mutation.  Normal protein C&S, negative anticardiolipin antibodies.  Negative beta glycoprotein antibodies. Positive lupus anticoagulant, recommend to repeat.  We have called Dr. Maree Krabbe office and patient will have lupus anticoagulant rechecked.  If negative, patient may be switched back to Xarelto or stay on Coumadin. If positive then he will need criteria of APS and need long-term anticoagulation with Coumadin. Patient agrees with the plan.  Lung nodule Repeat CT in 6 to 12 months.  Dr. Karna Christmas has ordered CT scan.   Orders Placed This Encounter  Procedures   CBC with Differential/Platelet    Standing Status:   Future    Standing Expiration Date:   05/15/2023   Comprehensive metabolic panel    Standing Status:   Future    Standing Expiration Date:   05/14/2023   Follow-up in 6 months. All questions were answered. The patient knows to call the clinic with any problems, questions or concerns.  [name], MD, PhD [facility] Hematology Oncology 05/14/2022    CHIEF COMPLAINTS/REASON FOR VISIT:  Follow-up for pulmonary embolism.  HISTORY OF PRESENTING ILLNESS:   Gerald Garcia is a  71 y.o.  male with PMH listed below was seen in consultation at the request of  [name], MD  for evaluation of pulmonary embolism.   Patient is on chronic treatment with Eliquis 5 mg twice daily for atrial fibrillation.  Patient reports being compliant on the medication. He has noted no extremity swelling for about 6 months. He has had chronic shortness of breath with exertion for over a year and he follows up  with pulmonology Dr. Karna Christmas.  Patient takes Lasix for CHF and chronic edema.  He also has COPD/asthma.  Despite optimizing breathing treatments, he continues to have chronic shortness of breath with ambulation.  CT was ordered for further evaluation . 01/31/2022, CT angiogram chest pulm embolism protocol showed PE identified descending left interlobar pulmonary artery and its branches, upper lobe branches of the pulmonary arteries bilaterally.  No evidence of right heart strain.  Heart size is normal.  0.7 cm right upper lobe pulmonary nodule.  Patient is a former smoker.  Patient denies any unintentional weight loss, night sweats, fever, cough, hemoptysis.  Denies any family history of VTE.  Denies any previous personal history of VTE. He is overdue for colonoscopy and he declines. He is on testosterone cream supplementation managed by urology Dr. Sheppard Penton. 07/19/2021 CBC did not show any erythrocytosis.  Instead hemoglobin was slightly decreased at 11.7. Patient was referred to establish care with hematology for further evaluation. Chronic shortness of breath with exertion.  No chest pain, hemoptysis.  In August 2022, patient passed out and hit his brain and developed brain hematoma.  Patient had a series of CT head done.  Last CT scan on 02/28/2021 which showed right temporal lobe parenchymal hematoma shows decreased density compared to prior studies.  Improvement of the subdural hematoma.   INTERVAL HISTORY Gerald Garcia is a 71 y.o. male who has above history reviewed by me today presents for follow up visit for pulmonary embolism.  Patient's anticoagulation was switched from Eliquis to Xarelto due  to development of pulmonary embolism despite him being compliant on Eliquis. He had hypercoagulable work  up which showed positive lupus anticoagulant.  Patient was switched to Coumadin with INR goal of 2-3.  Managed by primary care provider. Today patient reports feeling well.  He tolerates  anticoagulation.  No bleeding events.    MEDICAL HISTORY:  Past Medical History:  Diagnosis Date   Asthma    Atrial fibrillation (HCC)    Hypertension     SURGICAL HISTORY: Past Surgical History:  Procedure Laterality Date   CARDIOVERSION N/A 10/05/2020   Procedure: CARDIOVERSION;  Surgeon: Dalia Heading, MD;  Location: [facility] ORS;  Service: Cardiovascular;  Laterality: N/A;   HERNIA REPAIR     x2 as infant   INGUINAL HERNIA REPAIR Left 01/20/2020   Procedure: HERNIA REPAIR INGUINAL ADULT;  Surgeon: Orson Ape, MD;  Location: [facility] ORS;  Service: Urology;  Laterality: Left;   INSERTION OF MESH Left 01/20/2020   Procedure: INSERTION OF MESH;  Surgeon: Orson Ape, MD;  Location: [facility] ORS;  Service: Urology;  Laterality: Left;    SOCIAL HISTORY: Social History   Socioeconomic History   Marital status: Single    Spouse name: Not on file   Number of children: Not on file   Years of education: Not on file   Highest education level: Not on file  Occupational History   Not on file  Tobacco Use   Smoking status: Former   Smokeless tobacco: Never  Vaping Use   Vaping Use: Never used  Substance and Sexual Activity   Alcohol use: Yes    Comment: occ   Drug use: Never   Sexual activity: Not on file  Other Topics Concern   Not on file  Social History Narrative   Not on file   Social Determinants of Health   Financial Resource Strain: Not on file  Food Insecurity: Not on file  Transportation Needs: Not on file  Physical Activity: Not on file  Stress: Not on file  Social Connections: Not on file  Intimate Partner Violence: Not on file    FAMILY HISTORY: History reviewed. No pertinent family history.  ALLERGIES:  is allergic to penicillins.  MEDICATIONS:  Current Outpatient Medications  Medication Sig Dispense Refill   albuterol (VENTOLIN HFA) 108 (90 Base) MCG/ACT inhaler Inhale 2 puffs into the lungs every 6 (six) hours as needed for wheezing or  shortness of breath. 8 g 0   BREO ELLIPTA 100-25 MCG/INH AEPB Inhale 1 puff into the lungs daily.     diazepam (VALIUM) 5 MG tablet Take 5 mg by mouth daily as needed for anxiety (flying).     diltiazem (CARDIZEM CD) 120 MG 24 hr capsule Take 1 capsule (120 mg total) by mouth daily. 30 capsule 0   docusate sodium (COLACE) 100 MG capsule Take 2 capsules (200 mg total) by mouth 2 (two) times daily. 120 capsule 3   emtricitabine-tenofovir (TRUVADA) 200-300 MG tablet Take 1 tablet by mouth daily.     finasteride (PROSCAR) 5 MG tablet Take 5 mg by mouth daily.     Ixekizumab (TALTZ) 80 MG/ML SOAJ Inject 80 mg into the skin every 30 (thirty) days.      metoprolol tartrate (LOPRESSOR) 25 MG tablet Take 1 tablet (25 mg total) by mouth 2 (two) times daily. 60 tablet 0   mirtazapine (REMERON) 30 MG tablet Take 30 mg by mouth at bedtime.     Multiple Vitamins-Minerals (MULTIVITAMIN WITH MINERALS) tablet Take 1  tablet by mouth daily.     spironolactone (ALDACTONE) 25 MG tablet Take 25 mg by mouth daily.     tadalafil (CIALIS) 5 MG tablet Take 5 mg by mouth daily.     warfarin (COUMADIN) 5 MG tablet Take 1 tablet (5 mg total) by mouth daily. 30 tablet 0   zolpidem (AMBIEN) 10 MG tablet Take 10 mg by mouth at bedtime.     lisinopril (ZESTRIL) 5 MG tablet Take 5 mg by mouth daily. (Patient not taking: Reported on 02/05/2022)     No current facility-administered medications for this visit.    Review of Systems  Constitutional:  Negative for appetite change, chills, fatigue, fever and unexpected weight change.  HENT:   Negative for hearing loss and voice change.   Eyes:  Negative for eye problems and icterus.  Respiratory:  Positive for shortness of breath. Negative for chest tightness and cough.   Cardiovascular:  Positive for leg swelling. Negative for chest pain.  Gastrointestinal:  Negative for abdominal distention and abdominal pain.  Endocrine: Negative for hot flashes.  Genitourinary:  Negative for  difficulty urinating, dysuria and frequency.   Musculoskeletal:  Negative for arthralgias.  Skin:  Negative for itching and rash.  Neurological:  Negative for light-headedness and numbness.  Hematological:  Negative for adenopathy. Does not bruise/bleed easily.  Psychiatric/Behavioral:  Negative for confusion.    PHYSICAL EXAMINATION: ECOG PERFORMANCE STATUS: 1 - Symptomatic but completely ambulatory Vitals:   05/14/22 1501  BP: (!) 135/49  Pulse: (!) 47  Resp: 18  Temp: 98.2 F (36.8 C)   Filed Weights   05/14/22 1501  Weight: 186 lb 8 oz (84.6 kg)    Physical Exam Constitutional:      General: He is not in acute distress. HENT:     Head: Normocephalic and atraumatic.  Eyes:     General: No scleral icterus. Cardiovascular:     Rate and Rhythm: Normal rate and regular rhythm.     Heart sounds: Normal heart sounds.  Pulmonary:     Effort: Pulmonary effort is normal. No respiratory distress.     Breath sounds: No wheezing.  Abdominal:     General: Bowel sounds are normal. There is no distension.     Palpations: Abdomen is soft.  Musculoskeletal:        General: No deformity. Normal range of motion.     Cervical back: Normal range of motion and neck supple.  Skin:    General: Skin is warm and dry.     Findings: No erythema or rash.  Neurological:     Mental Status: He is alert and oriented to person, place, and time. Mental status is at baseline.     Cranial Nerves: No cranial nerve deficit.  Psychiatric:        Mood and Affect: Mood normal.     LABORATORY DATA:  I have reviewed the data as listed    Latest Ref Rng & Units 05/14/2022    2:25 PM 02/05/2022    9:57 AM 02/13/2021   11:38 AM  CBC  WBC 4.0 - 10.5 K/uL 5.1  4.2  8.2   Hemoglobin 13.0 - 17.0 g/dL 13.6  14.0  15.6   Hematocrit 39.0 - 52.0 % 43.0  43.4  46.1   Platelets 150 - 400 K/uL 155  156  198       Latest Ref Rng & Units 05/14/2022    2:25 PM 02/05/2022    9:57 AM 02/13/2021  11:38 AM  CMP   Glucose 70 - 99 mg/dL 338  250  539   BUN 8 - 23 mg/dL 35  22  21   Creatinine 0.61 - 1.24 mg/dL 7.67  3.41  9.37   Sodium 135 - 145 mmol/L 138  137  136   Potassium 3.5 - 5.1 mmol/L 4.3  4.1  3.7   Chloride 98 - 111 mmol/L 106  101  96   CO2 22 - 32 mmol/L 25  29  29    Calcium 8.9 - 10.3 mg/dL 9.1  9.4  9.4   Total Protein 6.5 - 8.1 g/dL 7.6  7.8    Total Bilirubin 0.3 - 1.2 mg/dL 0.3  0.4    Alkaline Phos 38 - 126 U/L 39  38    AST 15 - 41 U/L 23  18    ALT 0 - 44 U/L 26  20        RADIOGRAPHIC STUDIES: I have personally reviewed the radiological images as listed and agreed with the findings in the report. No results found.

## 2022-05-14 NOTE — Assessment & Plan Note (Signed)
Repeat CT in 6 to 12 months.  Dr. Lanney Gins has ordered CT scan.

## 2022-05-14 NOTE — Assessment & Plan Note (Addendum)
Recommend long-term anticoagulation.  Currently is on Coumadin with INR goal of 2-3 Hyper coagulable work-up showed negative factor V Leiden mutation and prothrombin gene mutation.  Normal protein C&S, negative anticardiolipin antibodies.  Negative beta glycoprotein antibodies. Positive lupus anticoagulant, recommend to repeat.  We have called Dr. Juanell Fairly office and patient will have lupus anticoagulant rechecked.  If negative, patient may be switched back to Xarelto or stay on Coumadin. If positive then he will need criteria of APS and need long-term anticoagulation with Coumadin. Patient agrees with the plan.

## 2022-05-14 NOTE — Progress Notes (Signed)
Pt here for follow up. No new concerns voiced.   

## 2022-05-20 ENCOUNTER — Encounter: Payer: Self-pay | Admitting: Internal Medicine

## 2022-05-27 ENCOUNTER — Telehealth: Payer: Self-pay

## 2022-05-27 NOTE — Telephone Encounter (Signed)
Called and spoke to pt. He voiced that he would like to continue coumadin.

## 2022-05-27 NOTE — Telephone Encounter (Signed)
-----   Message from [name], MD sent at 05/25/2022  8:20 PM EST ----- Please let patient know that his repeat test showed negative. He can be switched back to Xarelto if he would like to. Or he may continue coumadin.  Let me know

## 2022-08-11 IMAGING — CT CT HEAD W/O CM
3 of 4 series · 12 of 47 positions shown, 14 images · non-contrast
Comparison: CT head 02/13/2021

CLINICAL DATA: Fall 02/07/2021 with intracranial hemorrhage.

EXAM:
CT HEAD WITHOUT CONTRAST
TECHNIQUE: Contiguous axial images were obtained from the base of the skull
through the vertex without intravenous contrast.

[Series 2: axial st head 5.00 ax · axial · 0.34mm/px · z∈[-503,-404]mm · 6 of 29 slices shown, 8 images]
[im 5/29  brain]
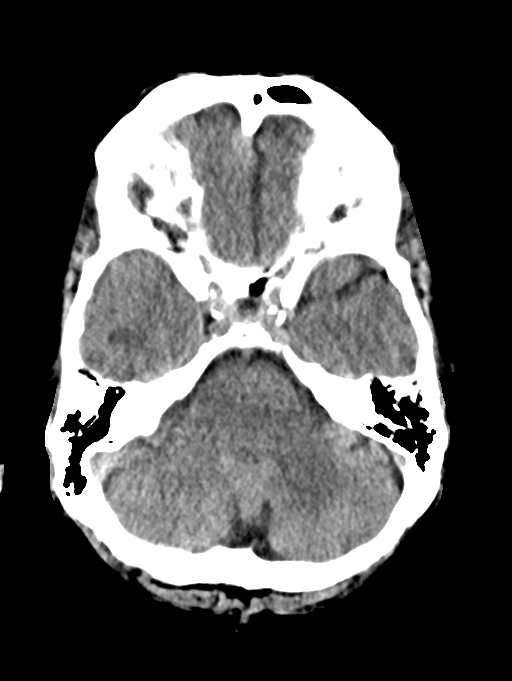
[im 5/29  bone]
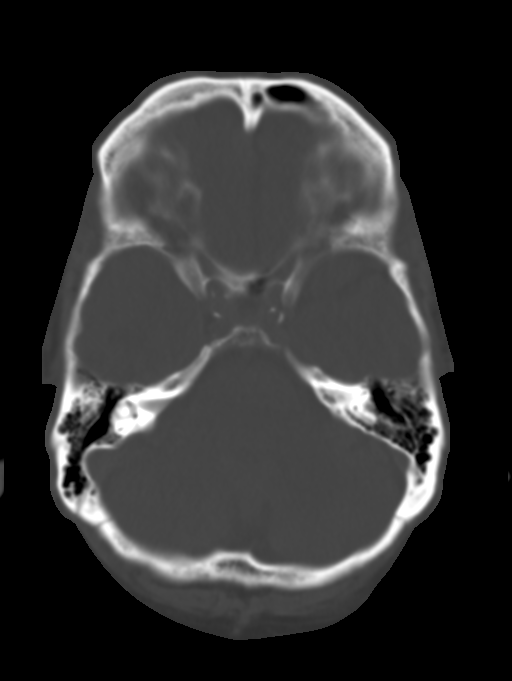
[im 9/29  brain]
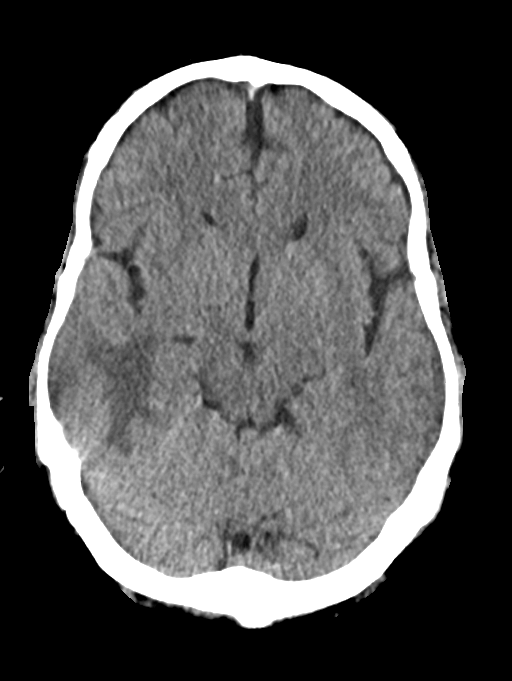
[im 13/29  brain]
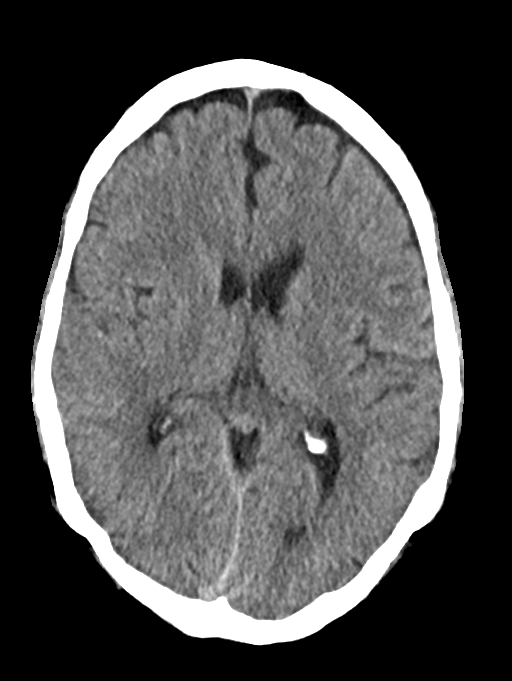
[im 17/29  brain]
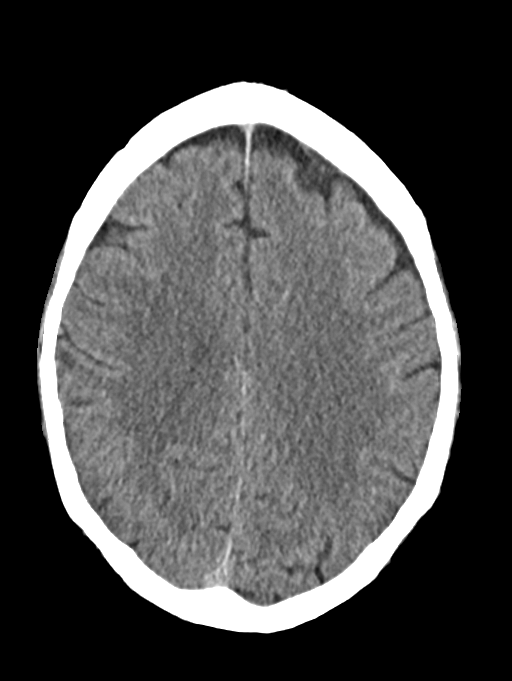
[im 21/29  brain]
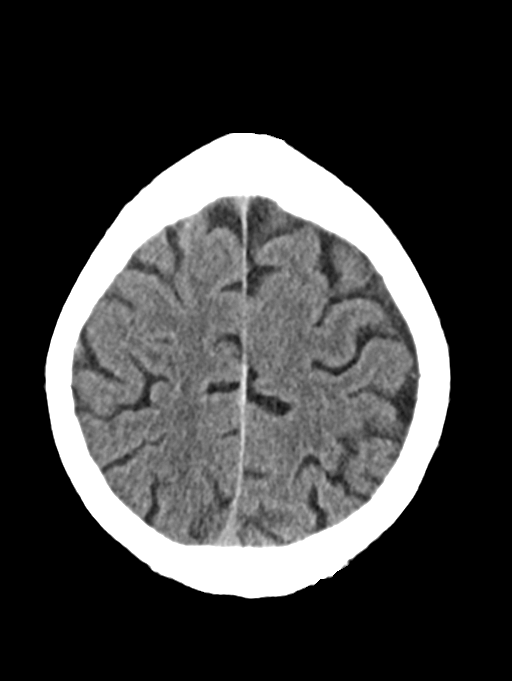
[im 21/29  bone]
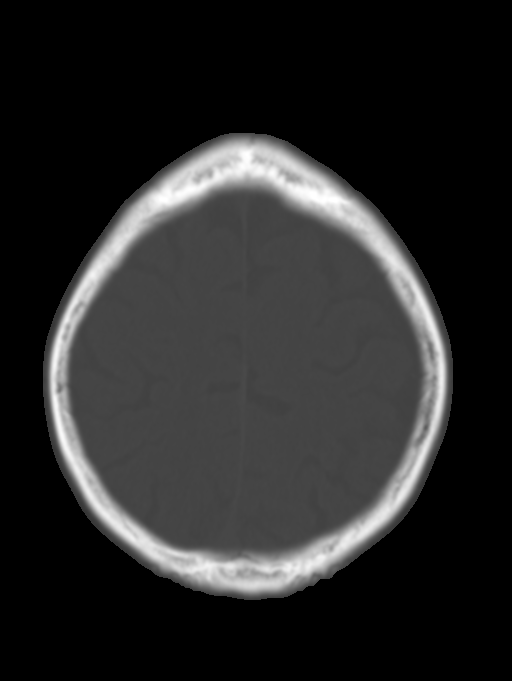
[im 25/29  brain]
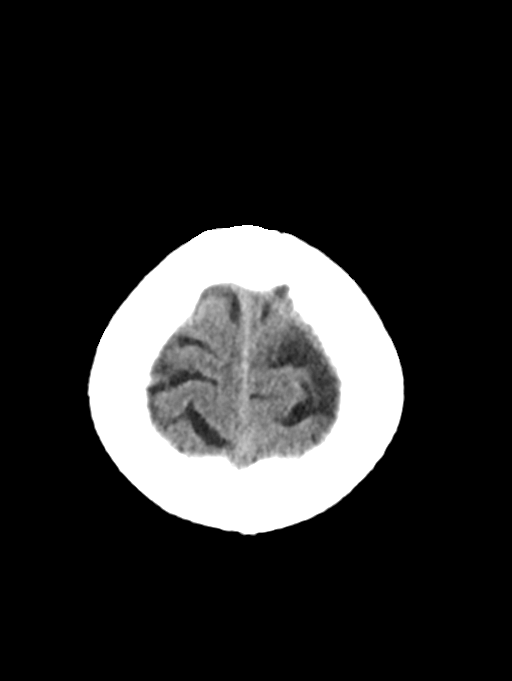

[Series 6: coronals head 3.00 cor · coronal · 0.29mm/px · 3 of 78 slices shown]
[im 26/78  brain]
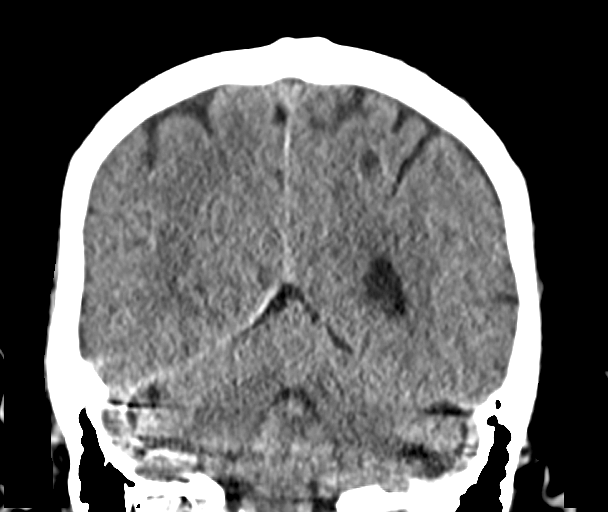
[im 35/78  brain]
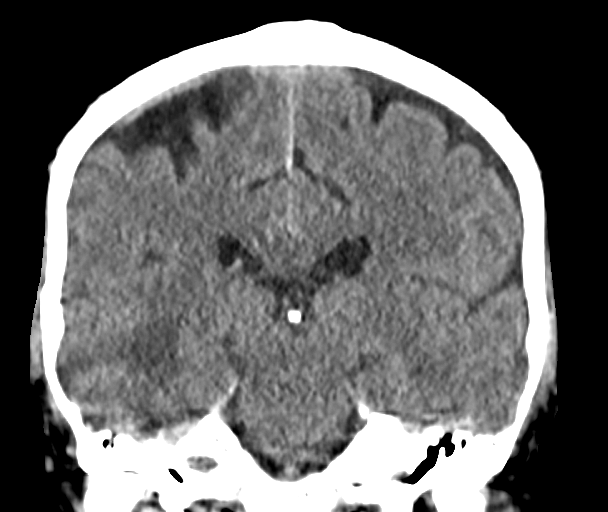
[im 43/78  brain]
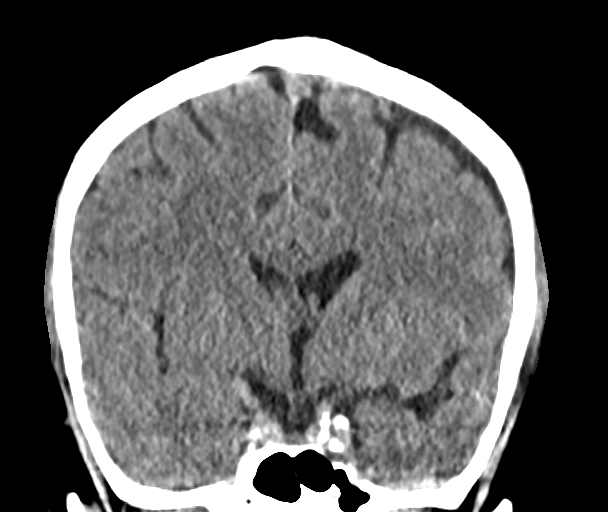

[Series 8: sagittals head 3.00 sag · sagittal · 0.29mm/px · 3 of 58 slices shown]
[im 20/58  brain]
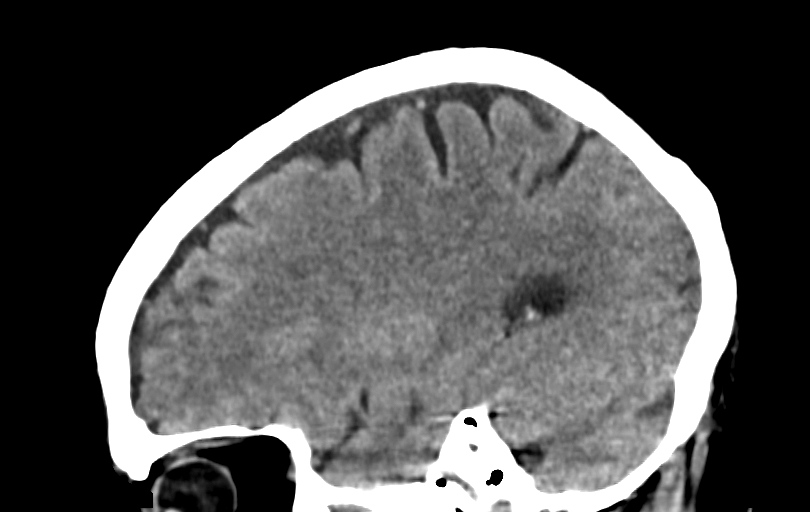
[im 29/58  brain]
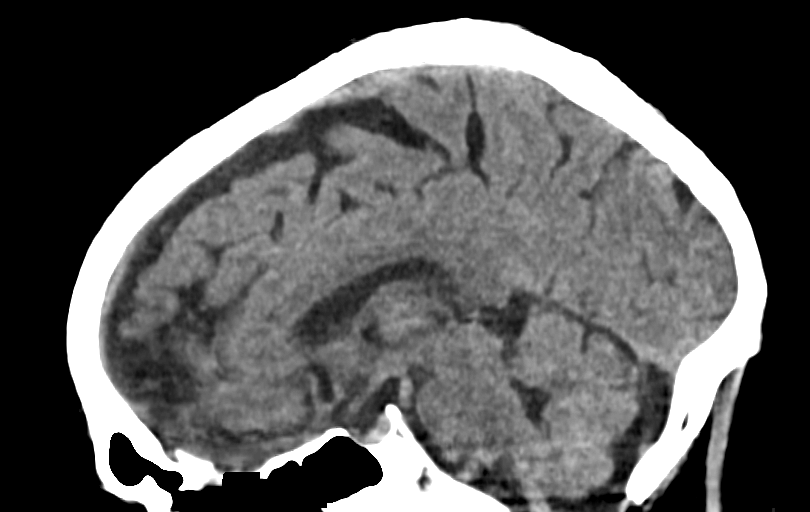
[im 39/58  brain]
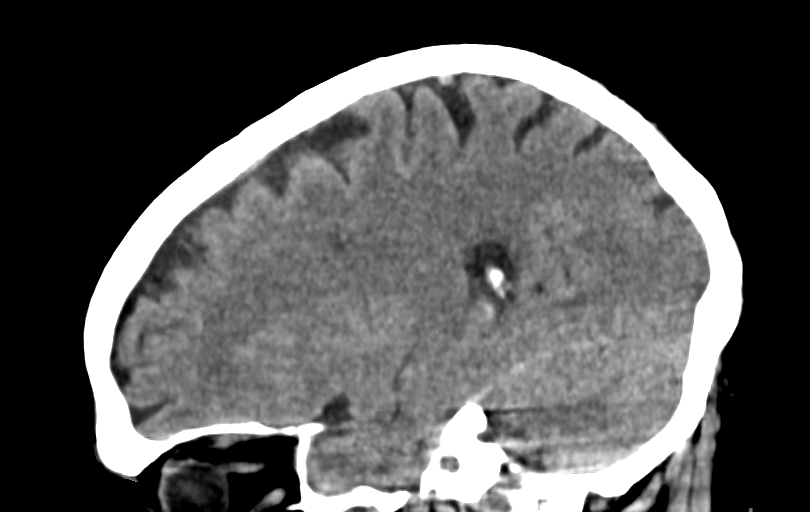

[12 of 47 positions shown; findings below may reference images not displayed]

FINDINGS: Brain: 2 cm hematoma right lateral temporal lobe is now isodense to
brain. There remains surrounding low-density edema in the right
temporal lobe similar to the prior study.

Interval resolution of small right-sided subdural hematoma. Near
complete resolution of low-density subdural fluid collection in the
left frontal region. No new area of hemorrhage

Ventricle size remains normal.  No acute infarct.

Vascular: Negative for hyperdense vessel

Skull: Negative for skull fracture

Sinuses/Orbits: Negative

Other: None
IMPRESSION: Right temporal lobe parenchymal hematoma shows decreased density
compared to the prior study. Adjacent low-density edema is
unchanged. This is presumably related to a traumatic hemorrhagic
contusion particularly given the prior subdural hematomas.

Resolution of right-sided subdural hematoma. Near complete
resolution of subdural low-density fluid collection left frontal
region. No hydrocephalus.

## 2022-11-12 ENCOUNTER — Ambulatory Visit: Payer: Commercial Managed Care - PPO | Admitting: Oncology

## 2022-11-12 ENCOUNTER — Other Ambulatory Visit: Payer: Commercial Managed Care - PPO

## 2023-06-19 ENCOUNTER — Other Ambulatory Visit: Payer: Self-pay | Admitting: Pulmonary Disease

## 2023-06-19 DIAGNOSIS — R911 Solitary pulmonary nodule: Secondary | ICD-10-CM

## 2023-11-17 ENCOUNTER — Telehealth (INDEPENDENT_AMBULATORY_CARE_PROVIDER_SITE_OTHER): Payer: Self-pay | Admitting: Nurse Practitioner

## 2023-11-17 NOTE — Telephone Encounter (Signed)
 LVM for patient to call to schedule appt at [facility]. np. 3 open wounds on leg  ref: Garcia,Gerald - appt per GS-per secure chat get in asap with anyone

## 2023-11-19 ENCOUNTER — Ambulatory Visit (INDEPENDENT_AMBULATORY_CARE_PROVIDER_SITE_OTHER): Admitting: Vascular Surgery

## 2023-11-19 ENCOUNTER — Encounter (INDEPENDENT_AMBULATORY_CARE_PROVIDER_SITE_OTHER): Payer: Self-pay | Admitting: Vascular Surgery

## 2023-11-19 ENCOUNTER — Other Ambulatory Visit (INDEPENDENT_AMBULATORY_CARE_PROVIDER_SITE_OTHER): Payer: Self-pay | Admitting: Vascular Surgery

## 2023-11-19 VITALS — BP 166/80 | HR 50 | Resp 18 | Ht 74.0 in | Wt 188.6 lb

## 2023-11-19 DIAGNOSIS — I10 Essential (primary) hypertension: Secondary | ICD-10-CM

## 2023-11-19 DIAGNOSIS — I4811 Longstanding persistent atrial fibrillation: Secondary | ICD-10-CM

## 2023-11-19 DIAGNOSIS — S81802A Unspecified open wound, left lower leg, initial encounter: Secondary | ICD-10-CM

## 2023-11-19 NOTE — Progress Notes (Signed)
 Subjective:    Patient ID: Gerald Garcia, male    DOB: 1951-03-30, 73 y.o.   MRN: [id number] Chief Complaint  Patient presents with   New Patient (Initial Visit)    np. 3 open wounds on leg  ref: Tate,Denny - appt per GS-per secure chat get in asap with anyone    Gerald Garcia is a 73 yo male who presents to clinic today with complaints of bilateral lower extremity known venous insufficiency, now with ulcers x 2 to his left lower extremity.  Patient states that these ulcers developed about 2 months ago and he has been treating them with over-the-counter antibiotic ointment.  They have not gotten any better so he went to see his primary care and they placed him on a 10-day course of oral Keflex.  He is in the middle of that right now as he is completed 5 days worth.  He endorses that he does not see any improvement but does not see it getting worse either. The 2 ulcerations are equivalent to the size of a quarter.  1 ulcer is at the lateral side of his ankle and the other is midway up his calf on the lateral side.  He endorses that he has had discoloration of his feet for many years now and has treated it with conservative measures prior to this which include rest elevation and compression socks.  He endorses that there is some drainage from time to time.  He endorses the ulcers are painful.    Review of Systems  Constitutional: Negative.   Respiratory: Negative.    Cardiovascular:        Hx of Atrial Fibrillation   Gastrointestinal: Negative.   Genitourinary: Negative.   Skin:  Positive for color change and wound.  Neurological: Negative.   Psychiatric/Behavioral: Negative.    All other systems reviewed and are negative.      Objective:    Physical Exam Vitals reviewed.  Constitutional:      Appearance: Normal appearance. He is normal weight.  HENT:     Head: Normocephalic.  Eyes:     Pupils: Pupils are equal, round, and reactive to light.  Cardiovascular:     Rate and Rhythm:  Normal rate. Rhythm irregular.     Pulses: Normal pulses.     Heart sounds: Normal heart sounds.  Pulmonary:     Effort: Pulmonary effort is normal.     Breath sounds: Normal breath sounds.  Abdominal:     General: Abdomen is flat. Bowel sounds are normal.     Palpations: Abdomen is soft.  Musculoskeletal:     Cervical back: Normal range of motion.     Left lower leg: Edema present.  Skin:    General: Skin is warm and dry.     Capillary Refill: Capillary refill takes more than 3 seconds.  Neurological:     General: No focal deficit present.     Mental Status: He is alert and oriented to person, place, and time. Mental status is at baseline.  Psychiatric:        Mood and Affect: Mood normal.        Behavior: Behavior normal.        Thought Content: Thought content normal.        Judgment: Judgment normal.    BP (!) 166/80   Pulse (!) 50   Resp 18   Ht 6\' 2"  (1.88 m)   Wt 188 lb 9.6 oz (85.5 kg)   BMI 24.21  kg/m   Past Medical History:  Diagnosis Date   Asthma    Atrial fibrillation (HCC)    Hypertension     Social History   Socioeconomic History   Marital status: Single    Spouse name: Not on file   Number of children: Not on file   Years of education: Not on file   Highest education level: Not on file  Occupational History   Not on file  Tobacco Use   Smoking status: Former   Smokeless tobacco: Never  Vaping Use   Vaping status: Never Used  Substance and Sexual Activity   Alcohol use: Yes    Comment: occ   Drug use: Never   Sexual activity: Not on file  Other Topics Concern   Not on file  Social History Narrative   Not on file   Social Drivers of Health   Financial Resource Strain: Not on file  Food Insecurity: Not on file  Transportation Needs: Not on file  Physical Activity: Not on file  Stress: Not on file  Social Connections: Not on file  Intimate Partner Violence: Not on file    Past Surgical History:  Procedure Laterality Date    CARDIOVERSION N/A 10/05/2020   Procedure: CARDIOVERSION;  Surgeon: Ronney Cola, MD;  Location: [facility] ORS;  Service: Cardiovascular;  Laterality: N/A;   HERNIA REPAIR     x2 as infant   INGUINAL HERNIA REPAIR Left 01/20/2020   Procedure: HERNIA REPAIR INGUINAL ADULT;  Surgeon: Rea Cambridge, MD;  Location: [facility] ORS;  Service: Urology;  Laterality: Left;   INSERTION OF MESH Left 01/20/2020   Procedure: INSERTION OF MESH;  Surgeon: Rea Cambridge, MD;  Location: [facility] ORS;  Service: Urology;  Laterality: Left;    History reviewed. No pertinent family history.  Allergies  Allergen Reactions   Penicillins Rash    Reaction: Childhood       Latest Ref Rng & Units 05/14/2022    2:25 PM 02/05/2022    9:57 AM 02/13/2021   11:38 AM  CBC  WBC 4.0 - 10.5 K/uL 5.1  4.2  8.2   Hemoglobin 13.0 - 17.0 g/dL 16.1  09.6  04.5   Hematocrit 39.0 - 52.0 % 43.0  43.4  46.1   Platelets 150 - 400 K/uL 155  156  198        CMP     Component Value Date/Time   NA 138 05/14/2022 1425   K 4.3 05/14/2022 1425   CL 106 05/14/2022 1425   CO2 25 05/14/2022 1425   GLUCOSE 113 (H) 05/14/2022 1425   BUN 35 (H) 05/14/2022 1425   CREATININE 0.88 05/14/2022 1425   CALCIUM 9.1 05/14/2022 1425   PROT 7.6 05/14/2022 1425   ALBUMIN 4.0 05/14/2022 1425   AST 23 05/14/2022 1425   ALT 26 05/14/2022 1425   ALKPHOS 39 05/14/2022 1425   BILITOT 0.3 05/14/2022 1425   GFRNONAA >60 05/14/2022 1425     No results found.     Assessment & Plan:   1. Multiple open wounds of lower leg, left, initial encounter (Primary) No surgery or intervention at this point in time.    I have had a long discussion with the patient regarding venous insufficiency and why it  causes symptoms, specifically venous ulceration. I have discussed with the patient the chronic skin changes that accompany venous insufficiency and the long term sequela such as infection and recurring  ulceration.  Patient will be placed in [place]  Boot on the  left which will be changed weekly drainage permitting.  In addition, behavioral modification including several periods of elevation of the lower extremities during the day will be continued. Achieving a position with the ankles at heart level was stressed to the patient  The patient is instructed to continue routine exercise, especially walking on a daily basis  In the future the patient can be assessed for graduated compression stockings, which he has been wearing, or wraps as well as a Lymph Pump once the ulcers are healed.  Patient will complete UNNA Boot wraps for the next 4 weeks. We will get Venous Ultrasounds with reflux studies in 4 weeks and reassess his wounds at that time.  Patient endorses he did not have a culture taken prior to antibiotics being started so therefore today I am culturing the ulcers.   2. Primary hypertension Continue antihypertensive medications as already ordered, these medications have been reviewed and there are no changes at this time.  3. Longstanding persistent atrial fibrillation (HCC) Continue antiarrhythmia medications as already ordered, these medications have been reviewed and there are no changes at this time.  Continue anticoagulation as ordered by Cardiology Service   Current Outpatient Medications on File Prior to Visit  Medication Sig Dispense Refill   albuterol  (VENTOLIN  HFA) 108 (90 Base) MCG/ACT inhaler Inhale 2 puffs into the lungs every 6 (six) hours as needed for wheezing or shortness of breath. 8 g 0   BREO ELLIPTA 100-25 MCG/INH AEPB Inhale 1 puff into the lungs daily.     cephALEXin (KEFLEX) 500 MG capsule Take 500 mg by mouth 3 (three) times daily.     diazepam (VALIUM) 5 MG tablet Take 5 mg by mouth daily as needed for anxiety (flying).     emtricitabine-tenofovir (TRUVADA) 200-300 MG tablet Take 1 tablet by mouth daily.     finasteride (PROSCAR) 5 MG tablet Take 5 mg by mouth daily.     flecainide (TAMBOCOR) 50 MG tablet Take 50 mg  by mouth 2 (two) times daily.     ipratropium-albuterol  (DUONEB) 0.5-2.5 (3) MG/3ML SOLN SMARTSIG:3 Milliliter(s) Twice Daily     Ixekizumab (TALTZ) 80 MG/ML SOAJ Inject 80 mg into the skin every 30 (thirty) days.      mirtazapine (REMERON) 30 MG tablet Take 30 mg by mouth at bedtime.     Multiple Vitamins-Minerals (MULTIVITAMIN WITH MINERALS) tablet Take 1 tablet by mouth daily.     spironolactone (ALDACTONE) 25 MG tablet Take 25 mg by mouth daily.     tadalafil (CIALIS) 5 MG tablet Take 5 mg by mouth daily.     torsemide (DEMADEX) 20 MG tablet Take 20 mg by mouth daily.     warfarin (COUMADIN ) 5 MG tablet Take 1 tablet (5 mg total) by mouth daily. 30 tablet 0   zolpidem (AMBIEN) 10 MG tablet Take 10 mg by mouth at bedtime.     diltiazem  (CARDIZEM  CD) 120 MG 24 hr capsule Take 1 capsule (120 mg total) by mouth daily. 30 capsule 0   docusate sodium  (COLACE) 100 MG capsule Take 2 capsules (200 mg total) by mouth 2 (two) times daily. 120 capsule 3   JARDIANCE 10 MG TABS tablet 10 mg. (Patient not taking: Reported on 11/19/2023)     lisinopril (ZESTRIL) 5 MG tablet Take 5 mg by mouth daily. (Patient not taking: Reported on 02/05/2022)     metoprolol  tartrate (LOPRESSOR ) 25 MG tablet Take 1 tablet (25 mg total) by mouth 2 (two) times  daily. 60 tablet 0   No current facility-administered medications on file prior to visit.    There are no Patient Instructions on file for this visit. No follow-ups on file.   Annamaria Barrette, NP

## 2023-11-24 ENCOUNTER — Other Ambulatory Visit (INDEPENDENT_AMBULATORY_CARE_PROVIDER_SITE_OTHER): Payer: Self-pay | Admitting: Vascular Surgery

## 2023-11-24 MED ORDER — CIPROFLOXACIN HCL 500 MG PO TABS
500.0000 mg | ORAL_TABLET | Freq: Two times a day (BID) | ORAL | 0 refills | Status: AC
Start: 2023-11-24 — End: 2023-12-04

## 2023-11-24 NOTE — Progress Notes (Signed)
 Mr. Gerald Garcia presented to clinic on 11/19/2023 with ulcerations and wounds to his lower extremity.  Cultures have come back which show heavy gram-negative rod growth with Pseudomonas aeruginosa.  He was currently on Keflex from his primary care physician.  I changed that and started him on Cipro  500 mg twice daily for 10 days as his Pseudomonas was sensitive to this via the cultures.  Patient was called at 830 5 in the morning on 11/24/2023 to alert him of this change and ask him to pick up the medication and start taking it today.  Phone number used call Gerald Garcia was [phone] which was on file in epic.

## 2023-11-25 ENCOUNTER — Encounter (INDEPENDENT_AMBULATORY_CARE_PROVIDER_SITE_OTHER): Payer: Self-pay

## 2023-11-25 LAB — AEROBIC CULTURE

## 2023-11-26 ENCOUNTER — Encounter (INDEPENDENT_AMBULATORY_CARE_PROVIDER_SITE_OTHER): Payer: Self-pay

## 2023-11-26 ENCOUNTER — Ambulatory Visit (INDEPENDENT_AMBULATORY_CARE_PROVIDER_SITE_OTHER): Admitting: Nurse Practitioner

## 2023-11-26 VITALS — BP 164/94 | HR 65 | Resp 18

## 2023-11-26 DIAGNOSIS — S81802A Unspecified open wound, left lower leg, initial encounter: Secondary | ICD-10-CM

## 2023-11-26 NOTE — Progress Notes (Unsigned)
 History of Present Illness  There is no documented history at this time  Assessments & Plan   There are no diagnoses linked to this encounter.    Additional instructions  Subjective:  Patient presents with venous ulcer of the Left lower extremity.    Procedure:  3 layer unna wrap was placed Left lower extremity.   Plan:   Follow up in one week.

## 2023-12-03 ENCOUNTER — Encounter (INDEPENDENT_AMBULATORY_CARE_PROVIDER_SITE_OTHER)

## 2023-12-05 ENCOUNTER — Encounter (INDEPENDENT_AMBULATORY_CARE_PROVIDER_SITE_OTHER): Payer: Self-pay

## 2023-12-05 ENCOUNTER — Ambulatory Visit (INDEPENDENT_AMBULATORY_CARE_PROVIDER_SITE_OTHER): Admitting: Nurse Practitioner

## 2023-12-05 VITALS — BP 130/69 | HR 106 | Resp 18

## 2023-12-05 DIAGNOSIS — S81802A Unspecified open wound, left lower leg, initial encounter: Secondary | ICD-10-CM

## 2023-12-05 MED ORDER — SULFAMETHOXAZOLE-TRIMETHOPRIM 800-160 MG PO TABS: 1.0000 | ORAL_TABLET | Freq: Two times a day (BID) | ORAL | 0 refills | Status: DC

## 2023-12-05 NOTE — Progress Notes (Signed)
 History of Present Illness  There is no documented history at this time  Assessments & Plan   There are no diagnoses linked to this encounter.    Additional instructions  Subjective:  Patient presents with venous ulcer of the Left lower extremity.    Procedure:  3 layer unna wrap was placed Left lower extremity.   Plan:   Follow up in one week.

## 2023-12-08 ENCOUNTER — Other Ambulatory Visit (INDEPENDENT_AMBULATORY_CARE_PROVIDER_SITE_OTHER): Payer: Self-pay | Admitting: Vascular Surgery

## 2023-12-10 ENCOUNTER — Encounter (INDEPENDENT_AMBULATORY_CARE_PROVIDER_SITE_OTHER)

## 2023-12-15 ENCOUNTER — Ambulatory Visit (INDEPENDENT_AMBULATORY_CARE_PROVIDER_SITE_OTHER): Admitting: Nurse Practitioner

## 2023-12-15 ENCOUNTER — Other Ambulatory Visit (INDEPENDENT_AMBULATORY_CARE_PROVIDER_SITE_OTHER): Payer: Self-pay

## 2023-12-15 ENCOUNTER — Encounter (INDEPENDENT_AMBULATORY_CARE_PROVIDER_SITE_OTHER): Payer: Self-pay

## 2023-12-15 VITALS — BP 139/73 | HR 70 | Resp 18 | Ht 74.0 in | Wt 200.8 lb

## 2023-12-15 DIAGNOSIS — S81802A Unspecified open wound, left lower leg, initial encounter: Secondary | ICD-10-CM

## 2023-12-15 NOTE — Progress Notes (Signed)
 History of Present Illness  There is no documented history at this time  Assessments & Plan   There are no diagnoses linked to this encounter.    Additional instructions  Subjective:  Patient presents with venous ulcer of the Left lower extremity.    Procedure:  3 layer unna wrap was placed Left lower extremity.   Plan:   Follow up in one week.

## 2023-12-19 ENCOUNTER — Encounter (INDEPENDENT_AMBULATORY_CARE_PROVIDER_SITE_OTHER): Payer: Self-pay | Admitting: Nurse Practitioner

## 2023-12-24 ENCOUNTER — Ambulatory Visit (INDEPENDENT_AMBULATORY_CARE_PROVIDER_SITE_OTHER): Admitting: Nurse Practitioner

## 2023-12-24 ENCOUNTER — Other Ambulatory Visit (INDEPENDENT_AMBULATORY_CARE_PROVIDER_SITE_OTHER): Payer: Self-pay | Admitting: Nurse Practitioner

## 2023-12-24 ENCOUNTER — Encounter (INDEPENDENT_AMBULATORY_CARE_PROVIDER_SITE_OTHER): Payer: Self-pay

## 2023-12-24 VITALS — BP 129/78 | HR 118 | Resp 18 | Ht 74.0 in | Wt 197.0 lb

## 2023-12-24 DIAGNOSIS — S81802A Unspecified open wound, left lower leg, initial encounter: Secondary | ICD-10-CM

## 2023-12-24 NOTE — Progress Notes (Signed)
 History of Present Illness  There is no documented history at this time  Assessments & Plan   There are no diagnoses linked to this encounter.    Additional instructions  Subjective:  Patient presents with venous ulcer of the Left lower extremity.    Procedure:  3 layer unna wrap was placed Left lower extremity.   Plan:   Follow up in one week.

## 2023-12-29 ENCOUNTER — Encounter (INDEPENDENT_AMBULATORY_CARE_PROVIDER_SITE_OTHER): Payer: Self-pay | Admitting: Vascular Surgery

## 2023-12-29 ENCOUNTER — Other Ambulatory Visit (INDEPENDENT_AMBULATORY_CARE_PROVIDER_SITE_OTHER): Payer: Self-pay | Admitting: Vascular Surgery

## 2023-12-29 DIAGNOSIS — S81802A Unspecified open wound, left lower leg, initial encounter: Secondary | ICD-10-CM

## 2023-12-30 LAB — AEROBIC CULTURE

## 2023-12-31 ENCOUNTER — Ambulatory Visit (INDEPENDENT_AMBULATORY_CARE_PROVIDER_SITE_OTHER)

## 2023-12-31 ENCOUNTER — Encounter (INDEPENDENT_AMBULATORY_CARE_PROVIDER_SITE_OTHER): Payer: Self-pay | Admitting: Vascular Surgery

## 2023-12-31 ENCOUNTER — Ambulatory Visit (INDEPENDENT_AMBULATORY_CARE_PROVIDER_SITE_OTHER): Admitting: Vascular Surgery

## 2023-12-31 VITALS — BP 130/71 | HR 72 | Resp 16 | Wt 192.6 lb

## 2023-12-31 DIAGNOSIS — I10 Essential (primary) hypertension: Secondary | ICD-10-CM

## 2023-12-31 DIAGNOSIS — S81802A Unspecified open wound, left lower leg, initial encounter: Secondary | ICD-10-CM | POA: Diagnosis not present

## 2023-12-31 DIAGNOSIS — I4811 Longstanding persistent atrial fibrillation: Secondary | ICD-10-CM | POA: Diagnosis not present

## 2023-12-31 MED ORDER — SULFAMETHOXAZOLE-TRIMETHOPRIM 800-160 MG PO TABS: 1.0000 | ORAL_TABLET | Freq: Two times a day (BID) | ORAL | 0 refills | Status: AC

## 2023-12-31 NOTE — Progress Notes (Signed)
 Subjective:    Patient ID: Gerald Garcia, male    DOB: October 25, 1950, 73 y.o.   MRN: [id number] Chief Complaint  Patient presents with   Follow-up    Unna and ultrasound follow up    Gerald Garcia is a 73 yo male who presents to clinic today in follow up related to left lower extremity lymphedema and swelling with multiple open wounds/ulcers.  His left lower extremity remains very erythemic and is warm today with palpable pulses.  His lymphedema and swelling has almost resolved.  He does have swelling +1 to his left forefoot which was outside the [employer] wrapping.  He continues to have 2 small ulcers/wounds to the lateral side of his left lower extremity above his ankle.  Previous culture that was taken returns as the patient having MRSA.  Patient has been on multiple courses of previous antibiotics.    Review of Systems  Constitutional: Negative.   Cardiovascular:  Positive for leg swelling.  Skin:  Positive for color change and wound.  All other systems reviewed and are negative.      Objective:   Physical Exam Vitals reviewed.  Constitutional:      Appearance: Normal appearance. He is normal weight.  HENT:     Head: Normocephalic.   Eyes:     Pupils: Pupils are equal, round, and reactive to light.    Cardiovascular:     Rate and Rhythm: Normal rate and regular rhythm.     Pulses: Normal pulses.     Heart sounds: Normal heart sounds.  Pulmonary:     Effort: Pulmonary effort is normal.     Breath sounds: Normal breath sounds.  Abdominal:     General: Abdomen is flat.     Palpations: Abdomen is soft.   Musculoskeletal:        General: Swelling present.     Left lower leg: Edema present.     Comments: Multiple open wounds the lateral side of the left ankle calf area.    Skin:    Capillary Refill: Capillary refill takes 2 to 3 seconds.     Findings: Erythema present.   Neurological:     General: No focal deficit present.     Mental Status: He is alert and oriented  to person, place, and time. Mental status is at baseline.   Psychiatric:        Mood and Affect: Mood normal.        Behavior: Behavior normal.        Thought Content: Thought content normal.        Judgment: Judgment normal.    BP 130/71   Pulse 72   Resp 16   Wt 192 lb 9.6 oz (87.4 kg)   BMI 24.73 kg/m   Past Medical History:  Diagnosis Date   Asthma    Atrial fibrillation (HCC)    Hypertension     Social History   Socioeconomic History   Marital status: Single    Spouse name: Not on file   Number of children: Not on file   Years of education: Not on file   Highest education level: Not on file  Occupational History   Not on file  Tobacco Use   Smoking status: Former   Smokeless tobacco: Never  Vaping Use   Vaping status: Never Used  Substance and Sexual Activity   Alcohol use: Yes    Comment: occ   Drug use: Never   Sexual activity: Not on file  Other Topics Concern   Not on file  Social History Narrative   Not on file   Social Drivers of Health   Financial Resource Strain: Not on file  Food Insecurity: Not on file  Transportation Needs: Not on file  Physical Activity: Not on file  Stress: Not on file  Social Connections: Not on file  Intimate Partner Violence: Not on file    Past Surgical History:  Procedure Laterality Date   CARDIOVERSION N/A 10/05/2020   Procedure: CARDIOVERSION;  Surgeon: Bosie Vinie LABOR, MD;  Location: [facility];  Service: Cardiovascular;  Laterality: N/A;   HERNIA REPAIR     x2 as infant   INGUINAL HERNIA REPAIR Left 01/20/2020   Procedure: HERNIA REPAIR INGUINAL ADULT;  Surgeon: Kassie Ozell SAUNDERS, MD;  Location: [facility];  Service: Urology;  Laterality: Left;   INSERTION OF MESH Left 01/20/2020   Procedure: INSERTION OF MESH;  Surgeon: Kassie Ozell SAUNDERS, MD;  Location: [facility];  Service: Urology;  Laterality: Left;    History reviewed. No pertinent family history.  Allergies  Allergen Reactions   Penicillins Rash     Reaction: Childhood       Latest Ref Rng & Units 05/14/2022    2:25 PM 02/05/2022    9:57 AM 02/13/2021   11:38 AM  CBC  WBC 4.0 - 10.5 K/uL 5.1  4.2  8.2   Hemoglobin 13.0 - 17.0 g/dL 86.3  85.9  84.3   Hematocrit 39.0 - 52.0 % 43.0  43.4  46.1   Platelets 150 - 400 K/uL 155  156  198       CMP     Component Value Date/Time   NA 138 05/14/2022 1425   K 4.3 05/14/2022 1425   CL 106 05/14/2022 1425   CO2 25 05/14/2022 1425   GLUCOSE 113 (H) 05/14/2022 1425   BUN 35 (H) 05/14/2022 1425   CREATININE 0.88 05/14/2022 1425   CALCIUM 9.1 05/14/2022 1425   PROT 7.6 05/14/2022 1425   ALBUMIN 4.0 05/14/2022 1425   AST 23 05/14/2022 1425   ALT 26 05/14/2022 1425   ALKPHOS 39 05/14/2022 1425   BILITOT 0.3 05/14/2022 1425   GFRNONAA >60 05/14/2022 1425     No results found.     Assessment & Plan:   1. Multiple open wounds of lower leg, left, initial encounter (Primary) Patient returns to clinic today for follow-up after 4 weeks of Unna boot wrapping to his left lower extremity.  Patient's left lower extremity is much less swollen with some slight's edema +1 to his forefoot.  He does remain very erythemic with venous pooling.  He does continue to have 2 small wounds/ulcers to the left lateral side of his ankle.  Those were cultured at one of his last visits and returned as being MRSA.  Patient will be started on Bactrim  double strength for 14 days.  We will continue Unna boot wraps for another 4 weeks and I will plan to see the patient personally myself in 1 month.  2. Primary hypertension Continue antiarrhythmia medications as already ordered, these medications have been reviewed and there are no changes at this time.  Continue anticoagulation as ordered by Cardiology Service  3. Longstanding persistent atrial fibrillation (HCC) Continue antiarrhythmia medications as already ordered, these medications have been reviewed and there are no changes at this time.  Continue  anticoagulation as ordered by Cardiology Service   Current Outpatient Medications on File Prior to Visit  Medication Sig Dispense Refill  albuterol  (VENTOLIN  HFA) 108 (90 Base) MCG/ACT inhaler Inhale 2 puffs into the lungs every 6 (six) hours as needed for wheezing or shortness of breath. 8 g 0   BREO ELLIPTA 100-25 MCG/INH AEPB Inhale 1 puff into the lungs daily.     cephALEXin (KEFLEX) 500 MG capsule Take 500 mg by mouth 3 (three) times daily. (Patient not taking: Reported on 12/31/2023)     diazepam (VALIUM) 5 MG tablet Take 5 mg by mouth daily as needed for anxiety (flying).     diltiazem  (CARDIZEM  CD) 120 MG 24 hr capsule Take 1 capsule (120 mg total) by mouth daily. (Patient not taking: Reported on 12/24/2023) 30 capsule 0   docusate sodium  (COLACE) 100 MG capsule Take 2 capsules (200 mg total) by mouth 2 (two) times daily. 120 capsule 3   emtricitabine-tenofovir (TRUVADA) 200-300 MG tablet Take 1 tablet by mouth daily.     finasteride (PROSCAR) 5 MG tablet Take 5 mg by mouth daily.     flecainide (TAMBOCOR) 50 MG tablet Take 50 mg by mouth 2 (two) times daily.     ipratropium-albuterol  (DUONEB) 0.5-2.5 (3) MG/3ML SOLN SMARTSIG:3 Milliliter(s) Twice Daily     Ixekizumab (TALTZ) 80 MG/ML SOAJ Inject 80 mg into the skin every 30 (thirty) days.      JARDIANCE 10 MG TABS tablet 10 mg.     lisinopril (ZESTRIL) 5 MG tablet Take 5 mg by mouth daily.     metoprolol  tartrate (LOPRESSOR ) 25 MG tablet Take 1 tablet (25 mg total) by mouth 2 (two) times daily. (Patient not taking: Reported on 12/24/2023) 60 tablet 0   mirtazapine (REMERON) 30 MG tablet Take 30 mg by mouth at bedtime.     Multiple Vitamins-Minerals (MULTIVITAMIN WITH MINERALS) tablet Take 1 tablet by mouth daily.     spironolactone (ALDACTONE) 25 MG tablet Take 25 mg by mouth daily.     tadalafil (CIALIS) 5 MG tablet Take 5 mg by mouth daily.     torsemide (DEMADEX) 20 MG tablet Take 20 mg by mouth daily.     warfarin (COUMADIN ) 5 MG  tablet Take 1 tablet (5 mg total) by mouth daily. 30 tablet 0   zolpidem (AMBIEN) 10 MG tablet Take 10 mg by mouth at bedtime.     No current facility-administered medications on file prior to visit.    There are no Patient Instructions on file for this visit. No follow-ups on file.   Gwendlyn JONELLE Shank, NP

## 2024-01-05 ENCOUNTER — Encounter (INDEPENDENT_AMBULATORY_CARE_PROVIDER_SITE_OTHER): Payer: Self-pay

## 2024-01-05 MED ORDER — LEVOFLOXACIN 500 MG PO TABS: 500.0000 mg | ORAL_TABLET | Freq: Every day | ORAL | 0 refills | Status: AC

## 2024-01-06 ENCOUNTER — Other Ambulatory Visit: Payer: Self-pay | Admitting: Pulmonary Disease

## 2024-01-06 ENCOUNTER — Ambulatory Visit
Admission: RE | Admit: 2024-01-06 | Discharge: 2024-01-06 | Disposition: A | Discharge status: Home / Self Care | Payer: Commercial Managed Care - PPO | Source: Ambulatory Visit | Attending: Pulmonary Disease | Admitting: Pulmonary Disease

## 2024-01-06 DIAGNOSIS — R911 Solitary pulmonary nodule: Secondary | ICD-10-CM | POA: Insufficient documentation

## 2024-01-07 ENCOUNTER — Encounter (INDEPENDENT_AMBULATORY_CARE_PROVIDER_SITE_OTHER): Payer: Self-pay | Admitting: Nurse Practitioner

## 2024-01-07 ENCOUNTER — Ambulatory Visit (INDEPENDENT_AMBULATORY_CARE_PROVIDER_SITE_OTHER): Admitting: Nurse Practitioner

## 2024-01-07 VITALS — BP 117/65 | HR 50 | Resp 18

## 2024-01-07 DIAGNOSIS — S81802A Unspecified open wound, left lower leg, initial encounter: Secondary | ICD-10-CM

## 2024-01-07 NOTE — Progress Notes (Signed)
 History of Present Illness  There is no documented history at this time  Assessments & Plan   There are no diagnoses linked to this encounter.    Additional instructions  Subjective:  Patient presents with venous ulcer of the Left lower extremity.    Procedure:  3 layer unna wrap was placed Left lower extremity.   Plan:   Follow up in one week.

## 2024-01-14 ENCOUNTER — Ambulatory Visit (INDEPENDENT_AMBULATORY_CARE_PROVIDER_SITE_OTHER): Admitting: Nurse Practitioner

## 2024-01-14 ENCOUNTER — Encounter (INDEPENDENT_AMBULATORY_CARE_PROVIDER_SITE_OTHER): Payer: Self-pay

## 2024-01-14 VITALS — BP 122/81 | HR 94 | Resp 18 | Ht 72.0 in | Wt 200.2 lb

## 2024-01-14 DIAGNOSIS — S81802A Unspecified open wound, left lower leg, initial encounter: Secondary | ICD-10-CM

## 2024-01-14 NOTE — Progress Notes (Signed)
 History of Present Illness  There is no documented history at this time  Assessments & Plan   There are no diagnoses linked to this encounter.    Additional instructions  Subjective:  Patient presents with venous ulcer of the Left lower extremity.    Procedure:  3 layer unna wrap was placed Left lower extremity.   Plan:   Follow up in one week.

## 2024-01-26 ENCOUNTER — Ambulatory Visit (INDEPENDENT_AMBULATORY_CARE_PROVIDER_SITE_OTHER): Admitting: Nurse Practitioner

## 2024-01-26 ENCOUNTER — Encounter (INDEPENDENT_AMBULATORY_CARE_PROVIDER_SITE_OTHER): Payer: Self-pay | Admitting: Nurse Practitioner

## 2024-01-26 DIAGNOSIS — S81802A Unspecified open wound, left lower leg, initial encounter: Secondary | ICD-10-CM

## 2024-01-26 NOTE — Progress Notes (Signed)
 History of Present Illness  There is no documented history at this time  Assessments & Plan   There are no diagnoses linked to this encounter.    Additional instructions  Subjective:  Patient presents with venous ulcer of the Left lower extremity.    Procedure:  3 layer unna wrap was placed Left lower extremity.   Plan:   Follow up in one week.

## 2024-02-02 ENCOUNTER — Encounter (INDEPENDENT_AMBULATORY_CARE_PROVIDER_SITE_OTHER): Payer: Self-pay

## 2024-02-02 ENCOUNTER — Ambulatory Visit (INDEPENDENT_AMBULATORY_CARE_PROVIDER_SITE_OTHER): Admitting: Vascular Surgery

## 2024-02-02 VITALS — BP 112/71 | HR 60 | Resp 18

## 2024-02-02 DIAGNOSIS — L97909 Non-pressure chronic ulcer of unspecified part of unspecified lower leg with unspecified severity: Secondary | ICD-10-CM | POA: Diagnosis not present

## 2024-02-02 DIAGNOSIS — I83009 Varicose veins of unspecified lower extremity with ulcer of unspecified site: Secondary | ICD-10-CM | POA: Diagnosis not present

## 2024-02-02 NOTE — Progress Notes (Signed)
 History of Present Illness  There is no documented history at this time  Assessments & Plan   There are no diagnoses linked to this encounter.    Additional instructions  Subjective:  Patient presents with venous ulcer of the Left lower extremity.    Procedure:  3 layer unna wrap was placed Left lower extremity.   Plan:   Follow up in one week.

## 2024-02-07 ENCOUNTER — Encounter (INDEPENDENT_AMBULATORY_CARE_PROVIDER_SITE_OTHER): Payer: Self-pay | Admitting: Vascular Surgery

## 2024-02-07 DIAGNOSIS — I83009 Varicose veins of unspecified lower extremity with ulcer of unspecified site: Secondary | ICD-10-CM | POA: Insufficient documentation

## 2024-02-09 ENCOUNTER — Ambulatory Visit (INDEPENDENT_AMBULATORY_CARE_PROVIDER_SITE_OTHER): Admitting: Vascular Surgery

## 2024-02-09 ENCOUNTER — Encounter (INDEPENDENT_AMBULATORY_CARE_PROVIDER_SITE_OTHER): Payer: Self-pay | Admitting: Vascular Surgery

## 2024-02-09 VITALS — BP 111/60 | HR 55 | Resp 18 | Ht 72.0 in | Wt 190.0 lb

## 2024-02-09 DIAGNOSIS — I4811 Longstanding persistent atrial fibrillation: Secondary | ICD-10-CM | POA: Diagnosis not present

## 2024-02-09 DIAGNOSIS — L97909 Non-pressure chronic ulcer of unspecified part of unspecified lower leg with unspecified severity: Secondary | ICD-10-CM

## 2024-02-09 DIAGNOSIS — I10 Essential (primary) hypertension: Secondary | ICD-10-CM | POA: Diagnosis not present

## 2024-02-09 DIAGNOSIS — I83009 Varicose veins of unspecified lower extremity with ulcer of unspecified site: Secondary | ICD-10-CM | POA: Diagnosis not present

## 2024-02-16 NOTE — Progress Notes (Signed)
 Subjective:    Patient ID: Gerald Garcia, male    DOB: 11/25/1950, 73 y.o.   MRN: [id number] Chief Complaint  Patient presents with   Follow-up    Follow up venous ulcer/unna wrap    Gerald Garcia is a 73 yo male who presents to clinic today in follow up related to left lower extremity lymphedema and swelling with open wounds/ulcers.  His left lower extremity remains erythremic but  getting better and is warm today with palpable pulses.  His lymphedema and swelling has almost resolved.  He does have swelling +1 to his left forefoot which was outside the [employer] wrapping.  He continues to have 2 small ulcers/wounds to the lateral side of his left lower extremity above his ankle.  Previous culture that was taken returns as the patient having MRSA.  Patient has been on multiple courses of previous antibiotics.    Review of Systems  Constitutional: Negative.   Cardiovascular:  Positive for leg swelling.  Skin:  Positive for color change and wound.  All other systems reviewed and are negative.      Objective:   Physical Exam Vitals reviewed.  Constitutional:      Appearance: Normal appearance. He is normal weight.  HENT:     Head: Normocephalic.  Eyes:     Pupils: Pupils are equal, round, and reactive to light.  Cardiovascular:     Rate and Rhythm: Normal rate and regular rhythm.     Pulses: Normal pulses.     Heart sounds: Normal heart sounds.  Pulmonary:     Effort: Pulmonary effort is normal.     Breath sounds: Normal breath sounds.  Abdominal:     General: Bowel sounds are normal.     Palpations: Abdomen is soft.  Musculoskeletal:        General: Swelling present.     Right lower leg: Edema present.     Left lower leg: Edema present.  Skin:    General: Skin is warm and dry.     Capillary Refill: Capillary refill takes 2 to 3 seconds.     Findings: Erythema present.  Neurological:     General: No focal deficit present.     Mental Status: He is alert and oriented to  person, place, and time. Mental status is at baseline.  Psychiatric:        Mood and Affect: Mood normal.        Behavior: Behavior normal.        Thought Content: Thought content normal.        Judgment: Judgment normal.     BP 111/60 (BP Location: Right Arm, Patient Position: Sitting, Cuff Size: Normal)   Pulse (!) 55   Resp 18   Ht 6' (1.829 m)   Wt 190 lb (86.2 kg)   BMI 25.77 kg/m   Past Medical History:  Diagnosis Date   Asthma    Atrial fibrillation (HCC)    Hypertension     Social History   Socioeconomic History   Marital status: Single    Spouse name: Not on file   Number of children: Not on file   Years of education: Not on file   Highest education level: Not on file  Occupational History   Not on file  Tobacco Use   Smoking status: Former   Smokeless tobacco: Never  Vaping Use   Vaping status: Never Used  Substance and Sexual Activity   Alcohol use: Yes    Comment: occ  Drug use: Never   Sexual activity: Not on file  Other Topics Concern   Not on file  Social History Narrative   Not on file   Social Drivers of Health   Financial Resource Strain: Low Risk  (01/13/2024)   Received from [facility]   Overall Financial Resource Strain (CARDIA)    Difficulty of Paying Living Expenses: Not hard at all  Food Insecurity: No Food Insecurity (01/13/2024)   Received from [facility]   Hunger Vital Sign    Within the past 12 months, you worried that your food would run out before you got the money to buy more.: Never true    Within the past 12 months, the food you bought just didn't last and you didn't have money to get more.: Never true  Transportation Needs: No Transportation Needs (01/13/2024)   Received from [facility] - Transportation    In the past 12 months, has lack of transportation kept you from medical appointments or from getting medications?: No    Lack of Transportation  (Non-Medical): No  Physical Activity: Not on file  Stress: Not on file  Social Connections: Not on file  Intimate Partner Violence: Not on file    Past Surgical History:  Procedure Laterality Date   CARDIOVERSION N/A 10/05/2020   Procedure: CARDIOVERSION;  Surgeon: Bosie Vinie LABOR, MD;  Location: [facility];  Service: Cardiovascular;  Laterality: N/A;   HERNIA REPAIR     x2 as infant   INGUINAL HERNIA REPAIR Left 01/20/2020   Procedure: HERNIA REPAIR INGUINAL ADULT;  Surgeon: Kassie Ozell SAUNDERS, MD;  Location: [facility];  Service: Urology;  Laterality: Left;   INSERTION OF MESH Left 01/20/2020   Procedure: INSERTION OF MESH;  Surgeon: Kassie Ozell SAUNDERS, MD;  Location: [facility];  Service: Urology;  Laterality: Left;    History reviewed. No pertinent family history.  Allergies  Allergen Reactions   Bactrim  [Sulfamethoxazole -Trimethoprim ] Diarrhea and Swelling    Facial swelling and difficulty breathing at night   Penicillins Rash    Reaction: Childhood       Latest Ref Rng & Units 05/14/2022    2:25 PM 02/05/2022    9:57 AM 02/13/2021   11:38 AM  CBC  WBC 4.0 - 10.5 K/uL 5.1  4.2  8.2   Hemoglobin 13.0 - 17.0 g/dL 86.3  85.9  84.3   Hematocrit 39.0 - 52.0 % 43.0  43.4  46.1   Platelets 150 - 400 K/uL 155  156  198       CMP     Component Value Date/Time   NA 138 05/14/2022 1425   K 4.3 05/14/2022 1425   CL 106 05/14/2022 1425   CO2 25 05/14/2022 1425   GLUCOSE 113 (H) 05/14/2022 1425   BUN 35 (H) 05/14/2022 1425   CREATININE 0.88 05/14/2022 1425   CALCIUM 9.1 05/14/2022 1425   PROT 7.6 05/14/2022 1425   ALBUMIN 4.0 05/14/2022 1425   AST 23 05/14/2022 1425   ALT 26 05/14/2022 1425   ALKPHOS 39 05/14/2022 1425   BILITOT 0.3 05/14/2022 1425   GFRNONAA >60 05/14/2022 1425     No results found.     Assessment & Plan:   1. Venous ulcer (HCC) (Primary) Patient returns to clinic today for follow-up after 4 weeks of Unna boot wrapping to his left lower extremity.  Patient's left lower extremity is much less swollen with some slight's edema +1 to his  forefoot. He does remain very erythemic with venous pooling but it is resolving slowly. He does continue to have 2 small wounds/ulcers to the left lateral side of his ankle. Those were cultured at one of his last visits and returned as being MRSA. Patient was treated with Bactrim  double strength for 14 days. We will continue Unna boot wraps for another 4 weeks and I will plan to see the patient personally myself in 1 month.   2. Primary hypertension Continue antiarrhythmia medications as already ordered, these medications have been reviewed and there are no changes at this time.   Continue anticoagulation as ordered by Cardiology Service  3. Longstanding persistent atrial fibrillation (HCC) Continue antiarrhythmia medications as already ordered, these medications have been reviewed and there are no changes at this time.   Continue anticoagulation as ordered by Cardiology Service   Current Outpatient Medications on File Prior to Visit  Medication Sig Dispense Refill   albuterol  (VENTOLIN  HFA) 108 (90 Base) MCG/ACT inhaler Inhale 2 puffs into the lungs every 6 (six) hours as needed for wheezing or shortness of breath. 8 g 0   BREO ELLIPTA 100-25 MCG/INH AEPB Inhale 1 puff into the lungs daily.     cephALEXin (KEFLEX) 500 MG capsule Take 500 mg by mouth 3 (three) times daily.     diazepam (VALIUM) 5 MG tablet Take 5 mg by mouth daily as needed for anxiety (flying).     diltiazem  (CARDIZEM  CD) 120 MG 24 hr capsule Take 1 capsule (120 mg total) by mouth daily. 30 capsule 0   docusate sodium  (COLACE) 100 MG capsule Take 2 capsules (200 mg total) by mouth 2 (two) times daily. 120 capsule 3   emtricitabine-tenofovir (TRUVADA) 200-300 MG tablet Take 1 tablet by mouth daily.     finasteride (PROSCAR) 5 MG tablet Take 5 mg by mouth daily.     flecainide (TAMBOCOR) 50 MG tablet Take 50 mg by mouth 2 (two) times daily.      ipratropium-albuterol  (DUONEB) 0.5-2.5 (3) MG/3ML SOLN SMARTSIG:3 Milliliter(s) Twice Daily     Ixekizumab (TALTZ) 80 MG/ML SOAJ Inject 80 mg into the skin every 30 (thirty) days.      levofloxacin  (LEVAQUIN ) 500 MG tablet Take 1 tablet (500 mg total) by mouth daily. 10 tablet 0   lisinopril (ZESTRIL) 5 MG tablet Take 5 mg by mouth daily.     metoprolol  tartrate (LOPRESSOR ) 25 MG tablet Take 1 tablet (25 mg total) by mouth 2 (two) times daily. 60 tablet 0   mirtazapine (REMERON) 30 MG tablet Take 30 mg by mouth at bedtime.     Multiple Vitamins-Minerals (MULTIVITAMIN WITH MINERALS) tablet Take 1 tablet by mouth daily.     spironolactone (ALDACTONE) 25 MG tablet Take 25 mg by mouth daily.     sulfamethoxazole -trimethoprim  (BACTRIM  DS) 800-160 MG tablet Take 1 tablet by mouth 2 (two) times daily. 20 tablet 0   tadalafil (CIALIS) 5 MG tablet Take 5 mg by mouth daily.     torsemide (DEMADEX) 20 MG tablet Take 20 mg by mouth daily.     warfarin (COUMADIN ) 5 MG tablet Take 1 tablet (5 mg total) by mouth daily. 30 tablet 0   zolpidem (AMBIEN) 10 MG tablet Take 10 mg by mouth at bedtime.     JARDIANCE 10 MG TABS tablet 10 mg.     No current facility-administered medications on file prior to visit.    There are no Patient Instructions on file for this visit. No follow-ups on  file.   Gwendlyn JONELLE Shank, NP

## 2024-02-17 ENCOUNTER — Ambulatory Visit (INDEPENDENT_AMBULATORY_CARE_PROVIDER_SITE_OTHER): Admitting: Nurse Practitioner

## 2024-02-17 VITALS — BP 109/52 | HR 52 | Resp 18 | Wt 199.4 lb

## 2024-02-17 DIAGNOSIS — L97909 Non-pressure chronic ulcer of unspecified part of unspecified lower leg with unspecified severity: Secondary | ICD-10-CM

## 2024-02-17 DIAGNOSIS — I83029 Varicose veins of left lower extremity with ulcer of unspecified site: Secondary | ICD-10-CM | POA: Diagnosis not present

## 2024-02-17 DIAGNOSIS — I83009 Varicose veins of unspecified lower extremity with ulcer of unspecified site: Secondary | ICD-10-CM

## 2024-02-17 NOTE — Progress Notes (Signed)
 History of Present Illness  There is no documented history at this time  Assessments & Plan   There are no diagnoses linked to this encounter.    Additional instructions  Subjective:  Patient presents with venous ulcer of the Left lower extremity.    Procedure:  3 layer unna wrap was placed Left lower extremity.   Plan:   Follow up in one week.

## 2024-02-20 ENCOUNTER — Encounter (INDEPENDENT_AMBULATORY_CARE_PROVIDER_SITE_OTHER): Payer: Self-pay

## 2024-02-22 ENCOUNTER — Encounter (INDEPENDENT_AMBULATORY_CARE_PROVIDER_SITE_OTHER): Payer: Self-pay | Admitting: Nurse Practitioner

## 2024-02-24 ENCOUNTER — Ambulatory Visit (INDEPENDENT_AMBULATORY_CARE_PROVIDER_SITE_OTHER): Admitting: Nurse Practitioner

## 2024-02-24 DIAGNOSIS — L97909 Non-pressure chronic ulcer of unspecified part of unspecified lower leg with unspecified severity: Secondary | ICD-10-CM | POA: Diagnosis not present

## 2024-02-24 DIAGNOSIS — I83029 Varicose veins of left lower extremity with ulcer of unspecified site: Secondary | ICD-10-CM

## 2024-02-24 NOTE — Progress Notes (Signed)
 History of Present Illness  There is no documented history at this time  Assessments & Plan   There are no diagnoses linked to this encounter.    Additional instructions  Subjective:  Patient presents with venous ulcer of the Left lower extremity.    Procedure:  3 layer unna wrap was placed Left lower extremity.   Plan:   Follow up in one week.

## 2024-02-29 ENCOUNTER — Encounter (INDEPENDENT_AMBULATORY_CARE_PROVIDER_SITE_OTHER): Payer: Self-pay | Admitting: Nurse Practitioner

## 2024-03-02 ENCOUNTER — Encounter (INDEPENDENT_AMBULATORY_CARE_PROVIDER_SITE_OTHER): Payer: Self-pay

## 2024-03-02 ENCOUNTER — Ambulatory Visit (INDEPENDENT_AMBULATORY_CARE_PROVIDER_SITE_OTHER): Admitting: Nurse Practitioner

## 2024-03-02 VITALS — BP 110/62 | HR 58 | Ht 72.0 in | Wt 199.0 lb

## 2024-03-02 DIAGNOSIS — L97909 Non-pressure chronic ulcer of unspecified part of unspecified lower leg with unspecified severity: Secondary | ICD-10-CM

## 2024-03-02 DIAGNOSIS — I83009 Varicose veins of unspecified lower extremity with ulcer of unspecified site: Secondary | ICD-10-CM

## 2024-03-02 DIAGNOSIS — I83029 Varicose veins of left lower extremity with ulcer of unspecified site: Secondary | ICD-10-CM | POA: Diagnosis not present

## 2024-03-02 NOTE — Progress Notes (Signed)
 History of Present Illness   There is no documented history at this time   Assessments & Plan   There are no diagnoses linked to this encounter.                          Additional instructions             Subjective:     Patient presents with venous ulcer of the Left lower extremity.                          Procedure:     3 layer unna wrap was placed Left lower extremity.               Plan:               Follow up in two week.

## 2024-03-09 ENCOUNTER — Encounter (INDEPENDENT_AMBULATORY_CARE_PROVIDER_SITE_OTHER)

## 2024-03-10 ENCOUNTER — Encounter (INDEPENDENT_AMBULATORY_CARE_PROVIDER_SITE_OTHER): Payer: Self-pay | Admitting: Nurse Practitioner

## 2024-03-10 ENCOUNTER — Ambulatory Visit (INDEPENDENT_AMBULATORY_CARE_PROVIDER_SITE_OTHER): Admitting: Nurse Practitioner

## 2024-03-10 VITALS — BP 149/73 | HR 66 | Resp 18

## 2024-03-10 DIAGNOSIS — I83009 Varicose veins of unspecified lower extremity with ulcer of unspecified site: Secondary | ICD-10-CM

## 2024-03-10 DIAGNOSIS — L97929 Non-pressure chronic ulcer of unspecified part of left lower leg with unspecified severity: Secondary | ICD-10-CM | POA: Diagnosis not present

## 2024-03-10 NOTE — Progress Notes (Signed)
 History of Present Illness  There is no documented history at this time  Assessments & Plan   There are no diagnoses linked to this encounter.    Additional instructions  Subjective:  Patient presents with venous ulcer of the Left lower extremity.    Procedure:  3 layer unna wrap was placed Left lower extremity.   Plan:   Follow up in one week.

## 2024-03-14 ENCOUNTER — Encounter (INDEPENDENT_AMBULATORY_CARE_PROVIDER_SITE_OTHER): Payer: Self-pay | Admitting: Nurse Practitioner

## 2024-03-16 ENCOUNTER — Ambulatory Visit (INDEPENDENT_AMBULATORY_CARE_PROVIDER_SITE_OTHER): Admitting: Vascular Surgery

## 2024-03-16 VITALS — BP 139/80 | HR 80 | Ht 72.0 in | Wt 201.0 lb

## 2024-03-16 DIAGNOSIS — I4811 Longstanding persistent atrial fibrillation: Secondary | ICD-10-CM | POA: Diagnosis not present

## 2024-03-16 DIAGNOSIS — L97909 Non-pressure chronic ulcer of unspecified part of unspecified lower leg with unspecified severity: Secondary | ICD-10-CM

## 2024-03-16 DIAGNOSIS — I83009 Varicose veins of unspecified lower extremity with ulcer of unspecified site: Secondary | ICD-10-CM | POA: Diagnosis not present

## 2024-03-16 DIAGNOSIS — I10 Essential (primary) hypertension: Secondary | ICD-10-CM | POA: Diagnosis not present

## 2024-03-17 ENCOUNTER — Encounter (INDEPENDENT_AMBULATORY_CARE_PROVIDER_SITE_OTHER): Payer: Self-pay | Admitting: Vascular Surgery

## 2024-03-17 NOTE — Progress Notes (Signed)
 Subjective:    Patient ID: Gerald Garcia, male    DOB: 19-Mar-1951, 73 y.o.   MRN: [id number] Chief Complaint  Patient presents with   Follow-up    Gerald Garcia is a 73 yo male who presents to clinic today in follow up related to left lower extremity lymphedema and swelling with open wounds/ulcers.  His left lower extremity remains erythremic but  getting better and is warm today with palpable pulses.  His lymphedema and swelling has almost resolved.  He does have swelling +1 to his left forefoot which was outside the [employer] wrapping.  He continues to have 1 small 2 mm ulcer/wound to the lateral side of his left lower extremity above his ankle.  Slowly healing. Previous culture that was taken returns as the patient having MRSA.  Patient has been on multiple courses of previous antibiotics.    Review of Systems  Constitutional: Negative.   Cardiovascular:  Positive for leg swelling.  Skin:  Positive for color change and wound.  All other systems reviewed and are negative.      Objective:   Physical Exam Vitals reviewed.  Constitutional:      Appearance: Normal appearance. He is normal weight.  HENT:     Head: Normocephalic.  Eyes:     Pupils: Pupils are equal, round, and reactive to light.  Cardiovascular:     Rate and Rhythm: Normal rate and regular rhythm.     Pulses: Normal pulses.     Heart sounds: Normal heart sounds.  Pulmonary:     Effort: Pulmonary effort is normal.     Breath sounds: Normal breath sounds.  Abdominal:     General: Abdomen is flat. Bowel sounds are normal.     Palpations: Abdomen is soft.  Musculoskeletal:        General: Swelling present. Normal range of motion.     Comments: Toes remain discolored and +1 edema  Skin:    General: Skin is warm and dry.     Capillary Refill: Capillary refill takes 2 to 3 seconds.     Findings: Erythema present.     Comments: 2 mm stasis ulcer to the left lateral side of his calf  Neurological:     General: No  focal deficit present.     Mental Status: He is alert and oriented to person, place, and time. Mental status is at baseline.  Psychiatric:        Mood and Affect: Mood normal.        Behavior: Behavior normal.        Thought Content: Thought content normal.        Judgment: Judgment normal.     BP 139/80   Pulse 80   Ht 6' (1.829 m)   Wt 201 lb (91.2 kg)   BMI 27.26 kg/m   Past Medical History:  Diagnosis Date   Asthma    Atrial fibrillation (HCC)    Hypertension     Social History   Socioeconomic History   Marital status: Single    Spouse name: Not on file   Number of children: Not on file   Years of education: Not on file   Highest education level: Not on file  Occupational History   Not on file  Tobacco Use   Smoking status: Former   Smokeless tobacco: Never  Vaping Use   Vaping status: Never Used  Substance and Sexual Activity   Alcohol use: Yes    Comment: occ  Drug use: Never   Sexual activity: Not on file  Other Topics Concern   Not on file  Social History Narrative   Not on file   Social Drivers of Health   Financial Resource Strain: Low Risk  (01/13/2024)   Received from [facility]   Overall Financial Resource Strain (CARDIA)    Difficulty of Paying Living Expenses: Not hard at all  Food Insecurity: No Food Insecurity (01/13/2024)   Received from [facility]   Hunger Vital Sign    Within the past 12 months, you worried that your food would run out before you got the money to buy more.: Never true    Within the past 12 months, the food you bought just didn't last and you didn't have money to get more.: Never true  Transportation Needs: No Transportation Needs (01/13/2024)   Received from [facility] - Transportation    In the past 12 months, has lack of transportation kept you from medical appointments or from getting medications?: No    Lack of Transportation (Non-Medical): No   Physical Activity: Not on file  Stress: Not on file  Social Connections: Not on file  Intimate Partner Violence: Not on file    Past Surgical History:  Procedure Laterality Date   CARDIOVERSION N/A 10/05/2020   Procedure: CARDIOVERSION;  Surgeon: Bosie Vinie LABOR, MD;  Location: [facility] OR[facility];  Service: Cardiovascular;  Laterality: N/A;   HERNIA REPAIR     x2 as infant   INGUINAL HERNIA REPAIR Left 01/20/2020   Procedure: HERNIA REPAIR INGUINAL ADULT;  Surgeon: Kassie Ozell SAUNDERS, MD;  Location: [facility] OR[facility];  Service: Urology;  Laterality: Left;   INSERTION OF MESH Left 01/20/2020   Procedure: INSERTION OF MESH;  Surgeon: Kassie Ozell SAUNDERS, MD;  Location: [facility];  Service: Urology;  Laterality: Left;    History reviewed. No pertinent family history.  Allergies  Allergen Reactions   Bactrim  [Sulfamethoxazole -Trimethoprim ] Diarrhea and Swelling    Facial swelling and difficulty breathing at night   Penicillins Rash    Reaction: Childhood       Latest Ref Rng & Units 05/14/2022    2:25 PM 02/05/2022    9:57 AM 02/13/2021   11:38 AM  CBC  WBC 4.0 - 10.5 K/uL 5.1  4.2  8.2   Hemoglobin 13.0 - 17.0 g/dL 86.3  85.9  84.3   Hematocrit 39.0 - 52.0 % 43.0  43.4  46.1   Platelets 150 - 400 K/uL 155  156  198       CMP     Component Value Date/Time   NA 138 05/14/2022 1425   K 4.3 05/14/2022 1425   CL 106 05/14/2022 1425   CO2 25 05/14/2022 1425   GLUCOSE 113 (H) 05/14/2022 1425   BUN 35 (H) 05/14/2022 1425   CREATININE 0.88 05/14/2022 1425   CALCIUM 9.1 05/14/2022 1425   PROT 7.6 05/14/2022 1425   ALBUMIN 4.0 05/14/2022 1425   AST 23 05/14/2022 1425   ALT 26 05/14/2022 1425   ALKPHOS 39 05/14/2022 1425   BILITOT 0.3 05/14/2022 1425   GFRNONAA >60 05/14/2022 1425     No results found.     Assessment & Plan:   1. Venous ulcer (HCC) (Primary) Patient returns to clinic today for follow-up after 4 weeks of Unna boot wrapping to his left lower extremity. Patient's left lower  extremity is much less swollen with some slight's edema +1 to his  forefoot. He does remain very erythemic with venous pooling but it is resolving slowly. He does continue to have 1 small 2 mm wound/ulcer to the left lateral side of his ankle. Those were cultured at one of his last visits and returned as being MRSA. Patient was treated with multiple courses of antibiotics. We will continue Unna boot wraps for another 4 weeks and I will plan to see the patient personally myself in 1 month.   2. Primary hypertension Continue antihypertensive medications as already ordered, these medications have been reviewed and there are no changes at this time.  3. Longstanding persistent atrial fibrillation (HCC) Continue antiarrhythmia medications as already ordered, these medications have been reviewed and there are no changes at this time.  Continue anticoagulation as ordered by Cardiology Service   Current Outpatient Medications on File Prior to Visit  Medication Sig Dispense Refill   albuterol  (VENTOLIN  HFA) 108 (90 Base) MCG/ACT inhaler Inhale 2 puffs into the lungs every 6 (six) hours as needed for wheezing or shortness of breath. 8 g 0   BREO ELLIPTA 100-25 MCG/INH AEPB Inhale 1 puff into the lungs daily.     cephALEXin (KEFLEX) 500 MG capsule Take 500 mg by mouth 3 (three) times daily.     diazepam (VALIUM) 5 MG tablet Take 5 mg by mouth daily as needed for anxiety (flying).     diltiazem  (CARDIZEM  CD) 120 MG 24 hr capsule Take 1 capsule (120 mg total) by mouth daily. 30 capsule 0   docusate sodium  (COLACE) 100 MG capsule Take 2 capsules (200 mg total) by mouth 2 (two) times daily. 120 capsule 3   emtricitabine-tenofovir (TRUVADA) 200-300 MG tablet Take 1 tablet by mouth daily.     finasteride (PROSCAR) 5 MG tablet Take 5 mg by mouth daily.     flecainide (TAMBOCOR) 50 MG tablet Take 50 mg by mouth 2 (two) times daily.     ipratropium-albuterol  (DUONEB) 0.5-2.5 (3) MG/3ML SOLN SMARTSIG:3 Milliliter(s)  Twice Daily     Ixekizumab (TALTZ) 80 MG/ML SOAJ Inject 80 mg into the skin every 30 (thirty) days.      JARDIANCE 10 MG TABS tablet 10 mg.     levofloxacin  (LEVAQUIN ) 500 MG tablet Take 1 tablet (500 mg total) by mouth daily. 10 tablet 0   lisinopril (ZESTRIL) 5 MG tablet Take 5 mg by mouth daily.     metoprolol  tartrate (LOPRESSOR ) 25 MG tablet Take 1 tablet (25 mg total) by mouth 2 (two) times daily. 60 tablet 0   mirtazapine (REMERON) 30 MG tablet Take 30 mg by mouth at bedtime.     Multiple Vitamins-Minerals (MULTIVITAMIN WITH MINERALS) tablet Take 1 tablet by mouth daily.     spironolactone (ALDACTONE) 25 MG tablet Take 25 mg by mouth daily.     sulfamethoxazole -trimethoprim  (BACTRIM  DS) 800-160 MG tablet Take 1 tablet by mouth 2 (two) times daily. 20 tablet 0   tadalafil (CIALIS) 5 MG tablet Take 5 mg by mouth daily.     torsemide (DEMADEX) 20 MG tablet Take 20 mg by mouth daily.     warfarin (COUMADIN ) 5 MG tablet Take 1 tablet (5 mg total) by mouth daily. 30 tablet 0   zolpidem (AMBIEN) 10 MG tablet Take 10 mg by mouth at bedtime.     No current facility-administered medications on file prior to visit.    There are no Patient Instructions on file for this visit. No follow-ups on file.   Gwendlyn JONELLE Shank, NP

## 2024-03-23 ENCOUNTER — Ambulatory Visit (INDEPENDENT_AMBULATORY_CARE_PROVIDER_SITE_OTHER): Admitting: Nurse Practitioner

## 2024-03-23 ENCOUNTER — Encounter (INDEPENDENT_AMBULATORY_CARE_PROVIDER_SITE_OTHER): Payer: Self-pay

## 2024-03-23 VITALS — BP 128/77 | HR 96 | Resp 16 | Ht 72.0 in | Wt 197.6 lb

## 2024-03-23 DIAGNOSIS — L97909 Non-pressure chronic ulcer of unspecified part of unspecified lower leg with unspecified severity: Secondary | ICD-10-CM | POA: Diagnosis not present

## 2024-03-23 DIAGNOSIS — I83029 Varicose veins of left lower extremity with ulcer of unspecified site: Secondary | ICD-10-CM | POA: Diagnosis not present

## 2024-03-23 NOTE — Progress Notes (Signed)
 History of Present Illness  There is no documented history at this time  Assessments & Plan   There are no diagnoses linked to this encounter.    Additional instructions  Subjective:  Patient presents with venous ulcer of the Left lower extremity.    Procedure:  3 layer unna wrap was placed Left lower extremity.   Plan:   Follow up in one week.    Dois Seip CMA

## 2024-03-28 ENCOUNTER — Encounter (INDEPENDENT_AMBULATORY_CARE_PROVIDER_SITE_OTHER): Payer: Self-pay | Admitting: Nurse Practitioner

## 2024-03-30 ENCOUNTER — Ambulatory Visit (INDEPENDENT_AMBULATORY_CARE_PROVIDER_SITE_OTHER): Admitting: Nurse Practitioner

## 2024-03-30 ENCOUNTER — Encounter (INDEPENDENT_AMBULATORY_CARE_PROVIDER_SITE_OTHER): Payer: Self-pay | Admitting: Nurse Practitioner

## 2024-03-30 VITALS — BP 125/72 | HR 84 | Resp 18

## 2024-03-30 DIAGNOSIS — I83009 Varicose veins of unspecified lower extremity with ulcer of unspecified site: Secondary | ICD-10-CM

## 2024-03-30 DIAGNOSIS — L97909 Non-pressure chronic ulcer of unspecified part of unspecified lower leg with unspecified severity: Secondary | ICD-10-CM

## 2024-03-30 DIAGNOSIS — I83029 Varicose veins of left lower extremity with ulcer of unspecified site: Secondary | ICD-10-CM

## 2024-04-02 NOTE — Progress Notes (Signed)
 History of Present Illness  There is no documented history at this time  Assessments & Plan   1. Venous ulcer (HCC) (Primary) ***     Additional instructions  Subjective:  Patient presents with venous ulcer of the Left lower extremity.    Procedure:  3 layer unna wrap was placed Left lower extremity.   Plan:   Follow up in one week.

## 2024-04-06 ENCOUNTER — Encounter (INDEPENDENT_AMBULATORY_CARE_PROVIDER_SITE_OTHER): Payer: Self-pay | Admitting: Nurse Practitioner

## 2024-04-06 ENCOUNTER — Ambulatory Visit (INDEPENDENT_AMBULATORY_CARE_PROVIDER_SITE_OTHER): Admitting: Nurse Practitioner

## 2024-04-06 VITALS — BP 117/64 | HR 92 | Resp 18 | Ht 72.0 in | Wt 198.0 lb

## 2024-04-06 DIAGNOSIS — L97909 Non-pressure chronic ulcer of unspecified part of unspecified lower leg with unspecified severity: Secondary | ICD-10-CM

## 2024-04-06 DIAGNOSIS — I83029 Varicose veins of left lower extremity with ulcer of unspecified site: Secondary | ICD-10-CM

## 2024-04-06 NOTE — Progress Notes (Signed)
 History of Present Illness  There is no documented history at this time  Assessments & Plan   There are no diagnoses linked to this encounter.    Additional instructions  Subjective:  Patient presents with venous ulcer of the left lower extremity.    Procedure:  3 layer unna wrap was placed Left lower extremity.   Plan:  Follow up in one week.   Pt reports his unna wrap began to slip and dig into his heel area so he removed his wrap a few days ago. No open areas present or drainage/weeping small scab at heel.  Dois Seip CMA

## 2024-04-13 ENCOUNTER — Ambulatory Visit (INDEPENDENT_AMBULATORY_CARE_PROVIDER_SITE_OTHER): Admitting: Nurse Practitioner

## 2024-04-13 ENCOUNTER — Encounter (INDEPENDENT_AMBULATORY_CARE_PROVIDER_SITE_OTHER): Payer: Self-pay

## 2024-04-13 VITALS — BP 130/78 | Resp 18 | Ht 72.0 in | Wt 201.4 lb

## 2024-04-13 DIAGNOSIS — I83009 Varicose veins of unspecified lower extremity with ulcer of unspecified site: Secondary | ICD-10-CM

## 2024-04-20 ENCOUNTER — Ambulatory Visit (INDEPENDENT_AMBULATORY_CARE_PROVIDER_SITE_OTHER): Admitting: Vascular Surgery

## 2024-08-03 ENCOUNTER — Encounter (INDEPENDENT_AMBULATORY_CARE_PROVIDER_SITE_OTHER): Payer: Self-pay | Admitting: Nurse Practitioner

## 2024-08-03 NOTE — Progress Notes (Signed)
 History of Present Illness  There is no documented history at this time  Assessments & Plan   1. Venous ulcer (HCC) (Primary) ***     Additional instructions  Subjective:  Patient presents with venous ulcer of the Left lower extremity.    Procedure:  3 layer unna wrap was placed Left lower extremity.   Plan:   Follow up in one week.
